# Patient Record
Sex: Male | Born: 1960 | Race: White | Hispanic: No | Marital: Married | Smoking: Former smoker
Health system: Southern US, Community
[De-identification: ages and names within clinical notes are randomized; demographics above are authoritative.]

## PROBLEM LIST (undated history)

## (undated) DIAGNOSIS — E249 Cushing's syndrome, unspecified: Secondary | ICD-10-CM

## (undated) HISTORY — PX: HERNIA REPAIR: SHX51

## (undated) HISTORY — PX: HEMORRHOID SURGERY: SHX153

## (undated) HISTORY — PX: OTHER SURGICAL HISTORY: SHX169

## (undated) HISTORY — PX: BACK SURGERY: SHX140

---

## 2000-09-25 ENCOUNTER — Other Ambulatory Visit: Admission: RE | Admit: 2000-09-25 | Discharge: 2000-09-25 | Payer: Self-pay | Admitting: Gastroenterology

## 2000-09-25 ENCOUNTER — Encounter (INDEPENDENT_AMBULATORY_CARE_PROVIDER_SITE_OTHER): Payer: Self-pay | Admitting: Specialist

## 2000-10-10 ENCOUNTER — Ambulatory Visit (HOSPITAL_COMMUNITY): Admission: RE | Admit: 2000-10-10 | Discharge: 2000-10-10 | Payer: Self-pay | Admitting: Gastroenterology

## 2000-10-10 ENCOUNTER — Encounter: Payer: Self-pay | Admitting: Gastroenterology

## 2005-04-03 ENCOUNTER — Ambulatory Visit: Payer: Self-pay | Admitting: Internal Medicine

## 2005-10-10 ENCOUNTER — Ambulatory Visit: Payer: Self-pay | Admitting: Internal Medicine

## 2007-07-27 ENCOUNTER — Emergency Department (HOSPITAL_COMMUNITY): Admission: EM | Admit: 2007-07-27 | Discharge: 2007-07-27 | Payer: Self-pay | Admitting: Emergency Medicine

## 2007-07-28 ENCOUNTER — Ambulatory Visit: Payer: Self-pay | Admitting: Internal Medicine

## 2007-07-31 ENCOUNTER — Ambulatory Visit: Payer: Self-pay | Admitting: Internal Medicine

## 2007-08-03 ENCOUNTER — Encounter: Admission: RE | Admit: 2007-08-03 | Discharge: 2007-08-03 | Payer: Self-pay | Admitting: Internal Medicine

## 2007-08-12 ENCOUNTER — Ambulatory Visit: Payer: Self-pay | Admitting: Internal Medicine

## 2007-08-19 ENCOUNTER — Encounter: Payer: Self-pay | Admitting: Internal Medicine

## 2007-08-20 ENCOUNTER — Ambulatory Visit (HOSPITAL_COMMUNITY): Admission: RE | Admit: 2007-08-20 | Discharge: 2007-08-21 | Payer: Self-pay | Admitting: Neurosurgery

## 2007-09-16 ENCOUNTER — Encounter: Payer: Self-pay | Admitting: Internal Medicine

## 2007-10-05 ENCOUNTER — Encounter: Payer: Self-pay | Admitting: Internal Medicine

## 2007-10-12 ENCOUNTER — Ambulatory Visit: Payer: Self-pay | Admitting: Internal Medicine

## 2007-10-12 DIAGNOSIS — E739 Lactose intolerance, unspecified: Secondary | ICD-10-CM

## 2007-10-12 DIAGNOSIS — F411 Generalized anxiety disorder: Secondary | ICD-10-CM | POA: Insufficient documentation

## 2007-10-12 DIAGNOSIS — Z8719 Personal history of other diseases of the digestive system: Secondary | ICD-10-CM

## 2007-10-12 DIAGNOSIS — I10 Essential (primary) hypertension: Secondary | ICD-10-CM | POA: Insufficient documentation

## 2007-10-12 DIAGNOSIS — Z860100 Personal history of colon polyps, unspecified: Secondary | ICD-10-CM | POA: Insufficient documentation

## 2007-10-12 DIAGNOSIS — K219 Gastro-esophageal reflux disease without esophagitis: Secondary | ICD-10-CM

## 2007-10-12 DIAGNOSIS — E785 Hyperlipidemia, unspecified: Secondary | ICD-10-CM | POA: Insufficient documentation

## 2007-10-12 DIAGNOSIS — R609 Edema, unspecified: Secondary | ICD-10-CM

## 2007-10-12 LAB — CONVERTED CEMR LAB
ALT: 49 units/L (ref 0–53)
Basophils Relative: 0.8 % (ref 0.0–1.0)
Bilirubin Urine: NEGATIVE
Bilirubin, Direct: 0.1 mg/dL (ref 0.0–0.3)
CO2: 30 meq/L (ref 19–32)
Cholesterol: 195 mg/dL (ref 0–200)
Eosinophils Absolute: 0.1 10*3/uL (ref 0.0–0.6)
Eosinophils Relative: 0.9 % (ref 0.0–5.0)
GFR calc Af Amer: 103 mL/min
GFR calc non Af Amer: 85 mL/min
Glucose, Bld: 99 mg/dL (ref 70–99)
Hemoglobin: 15 g/dL (ref 13.0–17.0)
Leukocytes, UA: NEGATIVE
Lymphocytes Relative: 19 % (ref 12.0–46.0)
MCV: 85.3 fL (ref 78.0–100.0)
Monocytes Absolute: 0.9 10*3/uL — ABNORMAL HIGH (ref 0.2–0.7)
Neutro Abs: 9.1 10*3/uL — ABNORMAL HIGH (ref 1.4–7.7)
Neutrophils Relative %: 72.5 % (ref 43.0–77.0)
Nitrite: NEGATIVE
PSA: 0.2 ng/mL (ref 0.10–4.00)
Potassium: 4.6 meq/L (ref 3.5–5.1)
Sodium: 140 meq/L (ref 135–145)
Specific Gravity, Urine: 1.015 (ref 1.000–1.03)
TSH: 0.89 microintl units/mL (ref 0.35–5.50)
Total CHOL/HDL Ratio: 3.9
Total Protein, Urine: NEGATIVE mg/dL
Total Protein: 7.3 g/dL (ref 6.0–8.3)
Triglycerides: 143 mg/dL (ref 0–149)
Urine Glucose: NEGATIVE mg/dL
WBC: 12.6 10*3/uL — ABNORMAL HIGH (ref 4.5–10.5)

## 2007-10-19 ENCOUNTER — Encounter: Admission: RE | Admit: 2007-10-19 | Discharge: 2007-10-19 | Payer: Self-pay | Admitting: Neurosurgery

## 2007-10-28 ENCOUNTER — Encounter: Payer: Self-pay | Admitting: Internal Medicine

## 2007-10-29 ENCOUNTER — Encounter: Payer: Self-pay | Admitting: Internal Medicine

## 2007-11-03 ENCOUNTER — Ambulatory Visit: Payer: Self-pay | Admitting: Unknown Physician Specialty

## 2007-11-24 ENCOUNTER — Ambulatory Visit: Payer: Self-pay | Admitting: Internal Medicine

## 2007-11-24 DIAGNOSIS — R1012 Left upper quadrant pain: Secondary | ICD-10-CM

## 2007-11-24 LAB — CONVERTED CEMR LAB
Albumin: 3.9 g/dL (ref 3.5–5.2)
Creatinine, Ser: 1.1 mg/dL (ref 0.4–1.5)
GFR calc non Af Amer: 76 mL/min
Phosphorus: 4.1 mg/dL (ref 2.3–4.6)
Potassium: 5 meq/L (ref 3.5–5.1)
Sodium: 141 meq/L (ref 135–145)

## 2007-11-27 ENCOUNTER — Telehealth (INDEPENDENT_AMBULATORY_CARE_PROVIDER_SITE_OTHER): Payer: Self-pay | Admitting: *Deleted

## 2007-12-03 ENCOUNTER — Encounter: Payer: Self-pay | Admitting: Internal Medicine

## 2008-01-05 ENCOUNTER — Encounter: Payer: Self-pay | Admitting: Internal Medicine

## 2008-01-22 ENCOUNTER — Ambulatory Visit: Payer: Self-pay | Admitting: Endocrinology

## 2008-01-22 ENCOUNTER — Encounter: Payer: Self-pay | Admitting: Internal Medicine

## 2008-01-27 LAB — CONVERTED CEMR LAB
CO2: 30 meq/L (ref 19–32)
Creatinine, Ser: 1.1 mg/dL (ref 0.4–1.5)
GFR calc non Af Amer: 76 mL/min
Glucose, Bld: 109 mg/dL — ABNORMAL HIGH (ref 70–99)

## 2008-02-08 ENCOUNTER — Ambulatory Visit: Payer: Self-pay | Admitting: Internal Medicine

## 2008-02-08 DIAGNOSIS — M79609 Pain in unspecified limb: Secondary | ICD-10-CM

## 2008-02-08 DIAGNOSIS — F528 Other sexual dysfunction not due to a substance or known physiological condition: Secondary | ICD-10-CM

## 2008-02-12 ENCOUNTER — Ambulatory Visit: Payer: Self-pay

## 2008-02-12 ENCOUNTER — Encounter: Payer: Self-pay | Admitting: Internal Medicine

## 2008-02-18 ENCOUNTER — Encounter: Payer: Self-pay | Admitting: Internal Medicine

## 2008-02-23 ENCOUNTER — Encounter: Payer: Self-pay | Admitting: Internal Medicine

## 2008-02-24 ENCOUNTER — Telehealth (INDEPENDENT_AMBULATORY_CARE_PROVIDER_SITE_OTHER): Payer: Self-pay | Admitting: *Deleted

## 2008-02-29 ENCOUNTER — Telehealth: Payer: Self-pay | Admitting: Endocrinology

## 2008-03-01 ENCOUNTER — Ambulatory Visit: Payer: Self-pay | Admitting: Internal Medicine

## 2008-03-01 DIAGNOSIS — R209 Unspecified disturbances of skin sensation: Secondary | ICD-10-CM | POA: Insufficient documentation

## 2008-03-24 ENCOUNTER — Telehealth: Payer: Self-pay | Admitting: Internal Medicine

## 2008-04-04 ENCOUNTER — Ambulatory Visit: Payer: Self-pay | Admitting: Internal Medicine

## 2008-04-05 ENCOUNTER — Telehealth (INDEPENDENT_AMBULATORY_CARE_PROVIDER_SITE_OTHER): Payer: Self-pay | Admitting: *Deleted

## 2008-04-05 DIAGNOSIS — IMO0002 Reserved for concepts with insufficient information to code with codable children: Secondary | ICD-10-CM | POA: Insufficient documentation

## 2008-05-02 ENCOUNTER — Telehealth (INDEPENDENT_AMBULATORY_CARE_PROVIDER_SITE_OTHER): Payer: Self-pay | Admitting: *Deleted

## 2008-05-03 ENCOUNTER — Encounter: Payer: Self-pay | Admitting: Internal Medicine

## 2008-05-04 ENCOUNTER — Encounter: Payer: Self-pay | Admitting: Internal Medicine

## 2008-05-05 ENCOUNTER — Telehealth: Payer: Self-pay | Admitting: Internal Medicine

## 2008-05-06 ENCOUNTER — Encounter: Payer: Self-pay | Admitting: Internal Medicine

## 2008-05-11 ENCOUNTER — Telehealth: Payer: Self-pay | Admitting: Internal Medicine

## 2008-05-27 ENCOUNTER — Encounter: Payer: Self-pay | Admitting: Internal Medicine

## 2008-07-07 ENCOUNTER — Encounter: Payer: Self-pay | Admitting: Internal Medicine

## 2009-02-28 ENCOUNTER — Encounter (INDEPENDENT_AMBULATORY_CARE_PROVIDER_SITE_OTHER): Payer: Self-pay | Admitting: *Deleted

## 2009-12-07 ENCOUNTER — Telehealth: Payer: Self-pay | Admitting: Gastroenterology

## 2010-12-27 NOTE — Progress Notes (Signed)
Summary: Schedule Colonosopy   Phone Note Outgoing Call Call back at [facility] Phone [phone]   Call placed by: Harlow Mares CMA Duncan Dull),  December 07, 2009 2:37 PM Call placed to: Patient Summary of Call: Left message on patients machine to call back. patient needs to schedule a direct colonoscopy Initial call taken by: Harlow Mares CMA Duncan Dull),  December 07, 2009 2:37 PM  Follow-up for Phone Call        No Answer just rang Follow-up by: Harlow Mares CMA Duncan Dull),  December 15, 2009 10:15 AM  Additional Follow-up for Phone Call Additional follow up Details #1::        number busy Additional Follow-up by: Harlow Mares CMA ([facility]AMA),  December 21, 2009 10:26 AM

## 2011-04-09 NOTE — Op Note (Signed)
NAMEELAD, MACPHAIL NO.:  [id number]   MEDICAL RECORD NO.:  [id number]          PATIENT TYPE:  OIB   LOCATION:  3020                         FACILITY:  [facility]   PHYSICIAN:  Hewitt Shorts, M.D.DATE OF BIRTH:  March 16, 1961   DATE OF PROCEDURE:  DATE OF DISCHARGE:                               OPERATIVE REPORT   PREOPERATIVE DIAGNOSES:  1. 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  2. 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  3. Lumbar spondylosis.  4. Lumbar radiculopathy.   POSTOPERATIVE DIAGNOSES:  1. 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  2. 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  3. Lumbar spondylosis.  4. Lumbar radiculopathy.   PROCEDURES:  1. Left L3 hemilaminectomy.  2. Resection of L2-3 synovial cyst and decompression of the left L3      nerve root with microdissection.  3. Left L4 foraminotomy and decompression of the left L4 nerve root      with microdissection.   SURGEON:  Hewitt Shorts, M.D.   ASSISTANT:  Jerald Kief, NP and Stefani Dama, M.D.   ANESTHESIA:  General endotracheal.   INDICATIONS FOR PROCEDURE:  Patient is a 50 year old man who presented  with left lumbar radiculopathy with weakness of the left iliopsoas.  He  was found to have a synovial cyst of the left side at L2-3 compressing  the exiting left L3 nerve root and he was found to have significant left  L3-4 lateral recess stenosis.  The decision was made to proceed with  decompression of the exiting L3 and L4 nerve roots via resection of the  synovial cyst and decompression of the lateral recess stenosis.   DESCRIPTION OF PROCEDURE:  Patient was brought to the operating room and  placed under general endotracheal anesthesia.  Patient was returned to a  prone position.  Lumbar region was prepped with Betadine and saline  solution and draped in a sterile fashion.  The midline was infiltrated  with local anesthetic with epinephrine.  An x-ray was taken and the L2-3  and L3-4 level was identified.  A midline  incision was made from the L2  to the L4 level and dissection was carried down to the subcutaneous  tissue.  Bipolar cautery and electrocautery was used to maintain  hemostasis.  Dissection was carried down to the lumbar fascia which was  incised to the left side of the midline in the paraspinal muscles.  We  dissected the spinous processes and lamina in a subperiosteal fashion.  A self-retaining retractor was placed.  We identified the L2, L3 and L4  lamina and the L2-3 and L3-4 interlaminar spaces.  An x-ray was taken to  confirm the localization.  Then the microscope was draped and brought  into the field to provide additional magnification, illumination, and  visualization and a decompression was performed using microdissection  and microsurgical technique.   We first performed a left L3 hemilaminectomy using the XMax drill and  Kerrison punches.  Edges of the bone were waxed as needed to maintain  hemostasis and we identified the ligament of flavum at the L3-4 level.  The laminectomy was  extended rostrally until we reached the ligamentum  flavum at the L2-3 level.  A complete hemilaminectomy was performed.  We  removed the ligamentum flavum to further decompress the spinal canal and  thecal sac.   We then proceeded with resection of the synovial cyst.  It was located  laterally to the left compressing the exiting the left L3 nerve root as  it exited into the L3-4 neural foramen.  We were able to mobilize it in  a piecemeal fashion and it was removed in its entirety with good  decompression of the thecal sac and exiting left L3 nerve root.   We then turned our attention to decompress the left L4 nerve root.  There was significant ligamentum flavum thickening and bony overgrowth  at the L3-4 level.  We were able to decompress the lateral recess  stenosis and extend the decompression into the L4 nerve root foramen and  decompress the left L4 nerve root.  The wound was then irrigated  with  bacitracin; incision checked for hemostasis, which was established with  the use of Gelfoam soaked in thrombin.  Once hemostasis was established,  we removed the Gelfoam.  Hemostasis was again confirmed and then we  instilled 2 mL of Fentanyl, 80 mg of Depo-Medrol into the epidural space  and proceeded with closure.  The deep fascia was closed interrupted  undyed 1 Vicryl sutures.  The Scarpa's fascia was closed with  interrupted undyed 1 Vicryl suture.  The subcutaneous and subcuticular  layer were closed with interrupted inverted 2-0 undyed Vicryl sutures  and the skin was reapproximated with Dermabond.  Procedure was tolerated  well.  The estimated blood loss was 50 ml.  Sponge and needle count were  correct.  Following surgery, the patient was turned back to the supine  position to be reversed from the anesthetic, extubated, and transferred  to the recovery room for further care.      Hewitt Shorts, M.D.  Electronically Signed     RWN/MEDQ  D:  08/20/2007  T:  08/20/2007  Job:  [id number]   cc:   Hewitt Shorts, M.D.

## 2011-09-05 LAB — BASIC METABOLIC PANEL
BUN: 12
CO2: 28
Calcium: 9.5
Chloride: 104
Creatinine, Ser: 0.98
GFR calc Af Amer: >60
GFR calc non Af Amer: >60
Glucose, Bld: 95
Potassium: 4.5
Sodium: 140

## 2011-09-05 LAB — CBC
HCT: 42.9
Hemoglobin: 14.2
MCHC: 33
MCV: 82.4
Platelets: 236
RBC: 5.21
RDW: 15.3 — ABNORMAL HIGH
WBC: 11 — ABNORMAL HIGH

## 2012-06-18 ENCOUNTER — Encounter: Payer: Self-pay | Admitting: Gastroenterology

## 2013-08-26 ENCOUNTER — Encounter (HOSPITAL_COMMUNITY): Payer: Self-pay

## 2013-08-26 ENCOUNTER — Emergency Department (HOSPITAL_COMMUNITY)
Admission: EM | Admit: 2013-08-26 | Discharge: 2013-08-27 | Disposition: A | Discharge status: Home / Self Care | Payer: Medicare Other | Attending: Emergency Medicine | Admitting: Emergency Medicine

## 2013-08-26 DIAGNOSIS — Z87891 Personal history of nicotine dependence: Secondary | ICD-10-CM | POA: Insufficient documentation

## 2013-08-26 DIAGNOSIS — Z880 Allergy status to penicillin: Secondary | ICD-10-CM | POA: Insufficient documentation

## 2013-08-26 DIAGNOSIS — R1013 Epigastric pain: Secondary | ICD-10-CM | POA: Insufficient documentation

## 2013-08-26 DIAGNOSIS — IMO0002 Reserved for concepts with insufficient information to code with codable children: Secondary | ICD-10-CM | POA: Insufficient documentation

## 2013-08-26 DIAGNOSIS — R110 Nausea: Secondary | ICD-10-CM | POA: Insufficient documentation

## 2013-08-26 DIAGNOSIS — E249 Cushing's syndrome, unspecified: Secondary | ICD-10-CM | POA: Insufficient documentation

## 2013-08-26 HISTORY — DX: Cushing's syndrome, unspecified: E24.9

## 2013-08-26 NOTE — ED Notes (Signed)
Pt arrives via EMS from home with abdominal pain that started around 2100.  Pt starts around the umbilicus and radiates to the back on both sides with nausea

## 2013-08-27 ENCOUNTER — Emergency Department (HOSPITAL_COMMUNITY)
Admission: EM | Admit: 2013-08-27 | Discharge: 2013-08-27 | Disposition: A | Discharge status: Home / Self Care | Payer: Medicare Other | Source: Home / Self Care | Attending: Emergency Medicine | Admitting: Emergency Medicine

## 2013-08-27 ENCOUNTER — Emergency Department (HOSPITAL_COMMUNITY): Payer: Medicare Other

## 2013-08-27 ENCOUNTER — Encounter (HOSPITAL_COMMUNITY): Payer: Self-pay | Admitting: Radiology

## 2013-08-27 ENCOUNTER — Encounter (HOSPITAL_COMMUNITY): Payer: Self-pay | Admitting: Physical Medicine and Rehabilitation

## 2013-08-27 DIAGNOSIS — Z87891 Personal history of nicotine dependence: Secondary | ICD-10-CM | POA: Insufficient documentation

## 2013-08-27 DIAGNOSIS — K3189 Other diseases of stomach and duodenum: Secondary | ICD-10-CM | POA: Insufficient documentation

## 2013-08-27 DIAGNOSIS — Z79899 Other long term (current) drug therapy: Secondary | ICD-10-CM | POA: Insufficient documentation

## 2013-08-27 DIAGNOSIS — Z862 Personal history of diseases of the blood and blood-forming organs and certain disorders involving the immune mechanism: Secondary | ICD-10-CM | POA: Insufficient documentation

## 2013-08-27 DIAGNOSIS — Z8639 Personal history of other endocrine, nutritional and metabolic disease: Secondary | ICD-10-CM | POA: Insufficient documentation

## 2013-08-27 DIAGNOSIS — IMO0002 Reserved for concepts with insufficient information to code with codable children: Secondary | ICD-10-CM | POA: Insufficient documentation

## 2013-08-27 DIAGNOSIS — R070 Pain in throat: Secondary | ICD-10-CM | POA: Insufficient documentation

## 2013-08-27 DIAGNOSIS — Z880 Allergy status to penicillin: Secondary | ICD-10-CM | POA: Insufficient documentation

## 2013-08-27 DIAGNOSIS — K30 Functional dyspepsia: Secondary | ICD-10-CM

## 2013-08-27 LAB — CBC
HCT: 40.5 % (ref 39.0–52.0)
Hemoglobin: 14.1 g/dL (ref 13.0–17.0)
MCH: 27 pg (ref 26.0–34.0)
MCHC: 34.8 g/dL (ref 30.0–36.0)
MCV: 77.4 fL — ABNORMAL LOW (ref 78.0–100.0)
RBC: 5.23 MIL/uL (ref 4.22–5.81)

## 2013-08-27 LAB — POCT I-STAT TROPONIN I: Troponin i, poc: 0 ng/mL (ref 0.00–0.08)

## 2013-08-27 LAB — TROPONIN I: Troponin I: <0.3 ng/mL (ref <0.30)

## 2013-08-27 LAB — POCT I-STAT, CHEM 8
BUN: 9 mg/dL (ref 6–23)
Calcium, Ion: 1.18 mmol/L (ref 1.12–1.23)
Creatinine, Ser: 1 mg/dL (ref 0.50–1.35)
Hemoglobin: 14.6 g/dL (ref 13.0–17.0)
Sodium: 141 mEq/L (ref 135–145)
TCO2: 22 mmol/L (ref 0–100)

## 2013-08-27 LAB — URINALYSIS, ROUTINE W REFLEX MICROSCOPIC
Hgb urine dipstick: NEGATIVE
Nitrite: NEGATIVE
Protein, ur: NEGATIVE mg/dL
Specific Gravity, Urine: 1.033 — ABNORMAL HIGH (ref 1.005–1.030)
Urobilinogen, UA: 0.2 mg/dL (ref 0.0–1.0)

## 2013-08-27 LAB — COMPREHENSIVE METABOLIC PANEL
ALT: 15 U/L (ref 0–53)
BUN: 10 mg/dL (ref 6–23)
CO2: 22 mEq/L (ref 19–32)
Calcium: 9.6 mg/dL (ref 8.4–10.5)
Creatinine, Ser: 1.01 mg/dL (ref 0.50–1.35)
GFR calc Af Amer: >90 mL/min (ref >90)
GFR calc non Af Amer: 82 mL/min — ABNORMAL LOW (ref >90)
Glucose, Bld: 129 mg/dL — ABNORMAL HIGH (ref 70–99)
Sodium: 139 mEq/L (ref 135–145)
Total Protein: 7.1 g/dL (ref 6.0–8.3)

## 2013-08-27 LAB — LIPASE, BLOOD: Lipase: 45 U/L (ref 11–59)

## 2013-08-27 MED ORDER — FAMOTIDINE 20 MG PO TABS
20.0000 mg | ORAL_TABLET | Freq: Once | ORAL | Status: AC
  Administered 2013-08-27: 20 mg via ORAL
  Filled 2013-08-27: qty 1

## 2013-08-27 MED ORDER — ONDANSETRON HCL 4 MG/2ML IJ SOLN
4.0000 mg | Freq: Once | INTRAMUSCULAR | Status: AC
  Administered 2013-08-27: 4 mg via INTRAVENOUS
  Filled 2013-08-27: qty 2

## 2013-08-27 MED ORDER — GI COCKTAIL ~~LOC~~
30.0000 mL | Freq: Once | ORAL | Status: AC
  Administered 2013-08-27: 30 mL via ORAL
  Filled 2013-08-27: qty 30

## 2013-08-27 MED ORDER — PANTOPRAZOLE SODIUM 40 MG IV SOLR
40.0000 mg | Freq: Once | INTRAVENOUS | Status: AC
  Administered 2013-08-27: 40 mg via INTRAVENOUS
  Filled 2013-08-27: qty 40

## 2013-08-27 MED ORDER — IOHEXOL 300 MG/ML  SOLN
25.0000 mL | INTRAMUSCULAR | Status: AC
  Administered 2013-08-27: 25 mL via ORAL

## 2013-08-27 MED ORDER — HYDROMORPHONE HCL PF 1 MG/ML IJ SOLN
1.0000 mg | Freq: Once | INTRAMUSCULAR | Status: AC
  Administered 2013-08-27: 1 mg via INTRAVENOUS
  Filled 2013-08-27: qty 1

## 2013-08-27 MED ORDER — FAMOTIDINE 20 MG PO TABS: 20.0000 mg | ORAL_TABLET | Freq: Two times a day (BID) | ORAL | Status: DC | PRN

## 2013-08-27 MED ORDER — FENTANYL CITRATE 0.05 MG/ML IJ SOLN
50.0000 ug | INTRAMUSCULAR | Status: DC | PRN
  Administered 2013-08-27: 50 ug via INTRAVENOUS
  Filled 2013-08-27: qty 2

## 2013-08-27 MED ORDER — ESOMEPRAZOLE MAGNESIUM 40 MG PO CPDR: 40.0000 mg | DELAYED_RELEASE_CAPSULE | Freq: Every day | ORAL | Status: DC

## 2013-08-27 MED ORDER — SODIUM CHLORIDE 0.9 % IV SOLN
INTRAVENOUS | Status: DC
  Administered 2013-08-27 (×2): via INTRAVENOUS

## 2013-08-27 MED ORDER — SUCRALFATE 1 G PO TABS: 1.0000 g | ORAL_TABLET | Freq: Four times a day (QID) | ORAL | Status: DC

## 2013-08-27 MED ORDER — IOHEXOL 300 MG/ML  SOLN
100.0000 mL | Freq: Once | INTRAMUSCULAR | Status: AC | PRN
  Administered 2013-08-27: 100 mL via INTRAVENOUS

## 2013-08-27 NOTE — ED Notes (Signed)
Pt presents to department for evaluation of sore throat. Was evaluated last night for chest pain, was given GI cocktail. Now states hoarse voice and blisters in throat. 8/10 pain at the time. Respirations unlabored. Pt is alert and oriented x4.

## 2013-08-27 NOTE — ED Provider Notes (Signed)
CSN: [id number]     Arrival date & time 08/27/13  1031 History   First MD Initiated Contact with Patient 08/27/13 1053     Chief Complaint  Patient presents with  . Sore Throat   (Consider location/radiation/quality/duration/timing/severity/associated sxs/prior Treatment) Patient is a 52 y.o. male presenting with pharyngitis. The history is provided by the patient and medical records.  Sore Throat Associated symptoms include a sore throat.   Pt presents to the ED for sore throat and hoarseness.  Pt  was seen and evaluated in the ED yesterday for chest pain/epigastric pain.  States he was given a GI cocktail and felt a burning sensation afterwards but was told it was normal.  Pt states he continued having throat pain but went to bed.  Upon waking this morning, his voice was raspy and he felt as though his throat is "raw" when swallowing.  No difficulty swallowing.  States he continues to have indigestion sx but has not taken any other medications.  No known allergies to components of GI cocktail.  No throat sx prior to GI cocktail.  Denies any sensation of SOB or that his throat is closing.  Pt does have a hx of reflux, used to take nexium, no problems in a while.  Past Medical History  Diagnosis Date  . Cushing syndrome    Past Surgical History  Procedure Laterality Date  . Putitary tumor removed    . Back surgery    . Hernia repair    . Hemorrhoid surgery     History reviewed. No pertinent family history. History  Substance Use Topics  . Smoking status: Former Smoker    Types: Cigars  . Smokeless tobacco: Not on file  . Alcohol Use: No    Review of Systems  HENT: Positive for sore throat.   All other systems reviewed and are negative.    Allergies  Codeine; Gabapentin; Hydrocodone-acetaminophen; and Penicillins  Home Medications   Current Outpatient Rx  Name  Route  Sig  Dispense  Refill  . esomeprazole (NEXIUM) 40 MG capsule   Oral   Take 1 capsule (40 mg total) by  mouth daily.   30 capsule   0   . hydrocortisone (CORTEF) 10 MG tablet   Oral   Take 20 mg by mouth daily.          BP 106/65  Pulse 49  Temp(Src) 98.2 F (36.8 C) (Oral)  Resp 18  Ht 6\' 2"  (1.88 m)  Wt 230 lb (104.327 kg)  BMI 29.52 kg/m2  SpO2 97%  Physical Exam  Nursing note and vitals reviewed. Constitutional: He is oriented to person, place, and time. He appears well-developed and well-nourished. No distress.  HENT:  Head: Normocephalic and atraumatic.  Mouth/Throat: Uvula is midline, oropharynx is clear and moist and mucous membranes are normal. No oral lesions. No trismus in the jaw. No oropharyngeal exudate, posterior oropharyngeal edema, posterior oropharyngeal erythema or tonsillar abscesses.  Oropharynx erythematous without visible blisters; tonsils appear mildly swollen without exudate or blisters; airway patent, pt speaking in full complete sentences; voice is raspy  Eyes: Conjunctivae and EOM are normal. Pupils are equal, round, and reactive to light.  Neck: Normal range of motion. Neck supple.  Cardiovascular: Normal rate, regular rhythm and normal heart sounds.   Pulmonary/Chest: Effort normal and breath sounds normal. No respiratory distress. He has no wheezes.  Abdominal: Soft. Bowel sounds are normal. There is no tenderness. There is no guarding.  Musculoskeletal: Normal range of motion.  Neurological: He is alert and oriented to person, place, and time.  Skin: Skin is warm and dry. He is not diaphoretic.  Psychiatric: He has a normal mood and affect.    ED Course  Procedures (including critical care time) Labs Review Labs Reviewed - No data to display Imaging Review   MDM   1. Throat pain   2. Acid indigestion     I have reviewed labs and imaging studies last night-- labs notable for mild hypokalemia at 3.3, CXR and CT negative for acute processes.  Initial and delta trop negative.  Pt was re-started on his nexium, given GI FU.  PE is largely  unremarkable, oropharynx appears slightly erythematous without noted exudate or blisters. Dr. Hyacinth Meeker has also evaluated the patient and agrees with assessment. Advised to start patient on Pepcid, Carafate, and use Chloraseptic Spray for acute relief of pain. Patient will followup with GI as previously discussed. Discussed plan with patient and family, they agreed. Return precautions advised.  Garlon Hatchet, PA-C 08/27/13 1239

## 2013-08-27 NOTE — ED Provider Notes (Signed)
CSN: [id number]     Arrival date & time 08/26/13  2349 History   First MD Initiated Contact with Patient 08/27/13 0017     Chief Complaint  Patient presents with  . Abdominal Pain   (Consider location/radiation/quality/duration/timing/severity/associated sxs/prior Treatment) HPI History provided by patient. At home tonight developed sharp epigastric pain radiating to his back. Associated nausea but no vomiting or diarrhea. No chest pain or shortness of breath. No known aggravating or alleviating factors. No history of same. Patient takes Cortef daily for history of Cushing syndrome, followed by primary care at [facility]. No history of similar symptoms. No black or tarry stools. No blood in stools. No heartburn or reflux. Symptoms moderate to severe. Past Medical History  Diagnosis Date  . Cushing syndrome    Past Surgical History  Procedure Laterality Date  . Putitary tumor removed    . Back surgery    . Hernia repair    . Hemorrhoid surgery     No family history on file. History  Substance Use Topics  . Smoking status: Former Smoker    Types: Cigars  . Smokeless tobacco: Not on file  . Alcohol Use: No    Review of Systems  Constitutional: Negative for fever and chills.  HENT: Negative for neck pain and neck stiffness.   Respiratory: Negative for shortness of breath.   Cardiovascular: Negative for chest pain.  Gastrointestinal: Positive for abdominal pain. Negative for blood in stool.  Genitourinary: Negative for dysuria and hematuria.  Musculoskeletal: Negative for myalgias.  Skin: Negative for rash.  Neurological: Negative for headaches.  All other systems reviewed and are negative.    Allergies  Codeine; Gabapentin; Hydrocodone-acetaminophen; and Penicillins  Home Medications   Current Outpatient Rx  Name  Route  Sig  Dispense  Refill  . hydrocortisone (CORTEF) 10 MG tablet   Oral   Take 20 mg by mouth daily.          BP 126/84  Pulse 52  Temp(Src) 98 F (36.7  C) (Oral)  Resp 12  SpO2 99% Physical Exam  Constitutional: He is oriented to person, place, and time. He appears well-developed and well-nourished.  HENT:  Head: Normocephalic and atraumatic.  Eyes: EOM are normal. Pupils are equal, round, and reactive to light. No scleral icterus.  Neck: Neck supple.  Cardiovascular: Normal rate, regular rhythm and intact distal pulses.   Pulmonary/Chest: Effort normal and breath sounds normal. No respiratory distress. He exhibits no tenderness.  Abdominal: Soft. Bowel sounds are normal.  Epigastric tenderness. No guarding or rebound. No abdominal tenderness otherwise. Negative Murphy sign. No tenderness over McBurney's point  Musculoskeletal: Normal range of motion. He exhibits no edema.  Neurological: He is alert and oriented to person, place, and time.  Skin: Skin is warm and dry.    ED Course  Procedures (including critical care time) Labs Review Labs Reviewed  CBC - Abnormal; Notable for the following:    MCV 77.4 (*)    All other components within normal limits  COMPREHENSIVE METABOLIC PANEL - Abnormal; Notable for the following:    Potassium 3.3 (*)    Glucose, Bld 129 (*)    GFR calc non Af Amer 82 (*)    All other components within normal limits  URINALYSIS, ROUTINE W REFLEX MICROSCOPIC - Abnormal; Notable for the following:    Specific Gravity, Urine 1.033 (*)    Ketones, ur 15 (*)    All other components within normal limits  POCT I-STAT, CHEM 8 -  Abnormal; Notable for the following:    Potassium 3.3 (*)    Glucose, Bld 128 (*)    All other components within normal limits  LIPASE, BLOOD  TROPONIN I   Imaging Review Ct Abdomen Pelvis W Contrast  08/27/2013   CLINICAL DATA:  Mid abdominal pain.  EXAM: CT ABDOMEN AND PELVIS WITH CONTRAST  TECHNIQUE: Multidetector CT imaging of the abdomen and pelvis was performed using the standard protocol following bolus administration of intravenous contrast.  CONTRAST:  OMNIPAQUE  IOHEXOL 300 MG/ML  SOLN  COMPARISON:  11/24/2007  FINDINGS: Lung bases are clear. No effusions. Heart is normal size.  Liver, gallbladder, spleen, pancreas, adrenals and kidneys are normal. Appendix is visualized and is normal.  Stomach, large and small bowel are unremarkable. Aorta is normal caliber. Urinary bladder is decompressed.  No acute bony abnormality. Degenerative changes in the lumbar spine.  IMPRESSION: No acute findings in the abdomen or pelvis.   Electronically Signed   By: Charlett Nose M.D.   On: 08/27/2013 02:14   Dg Chest Portable 1 View  08/27/2013   CLINICAL DATA:  Chest pain and shortness of Breath.  EXAM: PORTABLE CHEST - 1 VIEW  COMPARISON:  08/20/2007.  FINDINGS: The cardiac silhouette, mediastinal and hilar contours are within normal limits and stable. Bronchitic type lung changes are noted an most likely related to smoking. No infiltrates, effusions or edema. The bony thorax is intact. Remote healed fractures are noted.  IMPRESSION: No acute cardiopulmonary findings.   Electronically Signed   By: Loralie Champagne M.D.   On: 08/27/2013 00:37    Date: 08/27/2013  Rate: 52  Rhythm: sinus bradycardia  QRS Axis: normal  Intervals: normal  ST/T Wave abnormalities: nonspecific ST changes  Conduction Disutrbances:none  Narrative Interpretation:   Old EKG Reviewed: No significant changes  IV fentanyl. IV Zofran. IV Dilaudid. IV Protonix  3:37 AM on recheck pain improved. Has some mild epigastric tenderness - no acute abdomen. CT scan reviewed as above with patient. On further history he was previously taking Nexium and followed by [facility], has not had any problems with reflux or gastritis in some time.  Tolerates by mouth fluids, repeat troponin remains negative, symptomatically improved. Stable for discharge home. Patient requesting GI referral. He agrees to close primary care followup. He agrees to strict return precautions.  MDM  Dx: Epigastric pain  Evaluated with EKG,  labs, imaging or reviewed as above. Symptomatically improved with IV narcotics and medications. Vital signs and nursing notes reviewed and considered  Sunnie Nielsen, MD 08/27/13 [phone]

## 2013-08-27 NOTE — ED Provider Notes (Signed)
52 year old male, no cardiac risk factors who presents with a complaint of epigastric pain and a sore throat. On exam he has a erythematous throat but no signs of ulcerations, exudate, asymmetry or hypertrophy. His epigastrium is mildly tender, there is no guarding, no peritoneal signs, no distention. His heart and lung sounds are normal. According to the lab work and imaging that he had yesterday, there was no significant abnormalities found, he did have a normal EKG, normal CT scan and a normal chest x-ray. The patient is well-appearing, do not think this is cardiac disease, this is more gastrointestinal. He will be asked to use Chloraseptic spray in addition to Pepcid, proton pump inhibitor and Carafate in the short-term with gastroenterology followup   Medical screening examination/treatment/procedure(s) were conducted as a shared visit with non-physician practitioner(s) and myself.  I personally evaluated the patient during the encounter.   Vida Roller, MD 08/27/13 1125

## 2013-08-28 ENCOUNTER — Encounter (HOSPITAL_COMMUNITY): Payer: Self-pay | Admitting: *Deleted

## 2013-08-28 ENCOUNTER — Emergency Department (HOSPITAL_COMMUNITY)
Admission: EM | Admit: 2013-08-28 | Discharge: 2013-08-28 | Disposition: A | Discharge status: Home / Self Care | Payer: Medicare Other | Attending: Emergency Medicine | Admitting: Emergency Medicine

## 2013-08-28 DIAGNOSIS — IMO0002 Reserved for concepts with insufficient information to code with codable children: Secondary | ICD-10-CM | POA: Insufficient documentation

## 2013-08-28 DIAGNOSIS — Z862 Personal history of diseases of the blood and blood-forming organs and certain disorders involving the immune mechanism: Secondary | ICD-10-CM | POA: Insufficient documentation

## 2013-08-28 DIAGNOSIS — Z880 Allergy status to penicillin: Secondary | ICD-10-CM | POA: Insufficient documentation

## 2013-08-28 DIAGNOSIS — Z87891 Personal history of nicotine dependence: Secondary | ICD-10-CM | POA: Insufficient documentation

## 2013-08-28 DIAGNOSIS — Z79899 Other long term (current) drug therapy: Secondary | ICD-10-CM | POA: Insufficient documentation

## 2013-08-28 DIAGNOSIS — R1011 Right upper quadrant pain: Secondary | ICD-10-CM | POA: Insufficient documentation

## 2013-08-28 DIAGNOSIS — R112 Nausea with vomiting, unspecified: Secondary | ICD-10-CM | POA: Insufficient documentation

## 2013-08-28 DIAGNOSIS — Z8639 Personal history of other endocrine, nutritional and metabolic disease: Secondary | ICD-10-CM | POA: Insufficient documentation

## 2013-08-28 DIAGNOSIS — R1012 Left upper quadrant pain: Secondary | ICD-10-CM | POA: Insufficient documentation

## 2013-08-28 DIAGNOSIS — R1013 Epigastric pain: Secondary | ICD-10-CM

## 2013-08-28 LAB — CBC WITH DIFFERENTIAL/PLATELET
Basophils Absolute: 0 10*3/uL (ref 0.0–0.1)
Basophils Relative: 0 % (ref 0–1)
Eosinophils Absolute: 0.1 10*3/uL (ref 0.0–0.7)
Eosinophils Absolute: 0.1 10*3/uL (ref 0.0–0.7)
Eosinophils Relative: 1 % (ref 0–5)
Eosinophils Relative: 1 % (ref 0–5)
HCT: 41.5 % (ref 39.0–52.0)
Hemoglobin: 14.6 g/dL (ref 13.0–17.0)
Lymphocytes Relative: 20 % (ref 12–46)
Lymphocytes Relative: 18 % (ref 12–46)
Lymphs Abs: 2.9 10*3/uL (ref 0.7–4.0)
Lymphs Abs: 2.6 10*3/uL (ref 0.7–4.0)
MCH: 27.4 pg (ref 26.0–34.0)
MCH: 27.5 pg (ref 26.0–34.0)
MCHC: 35.2 g/dL (ref 30.0–36.0)
MCV: 78.3 fL (ref 78.0–100.0)
Monocytes Absolute: 1.2 10*3/uL — ABNORMAL HIGH (ref 0.1–1.0)
Monocytes Relative: 8 % (ref 3–12)
Neutrophils Relative %: 70 % (ref 43–77)
Neutrophils Relative %: 74 % (ref 43–77)
Platelets: 190 10*3/uL (ref 150–400)
Platelets: 204 10*3/uL (ref 150–400)
RBC: 5.29 MIL/uL (ref 4.22–5.81)
RBC: 5.3 MIL/uL (ref 4.22–5.81)
RDW: 13.5 % (ref 11.5–15.5)
WBC: 14 10*3/uL — ABNORMAL HIGH (ref 4.0–10.5)
WBC: 14.6 10*3/uL — ABNORMAL HIGH (ref 4.0–10.5)

## 2013-08-28 LAB — URINALYSIS, ROUTINE W REFLEX MICROSCOPIC
Bilirubin Urine: NEGATIVE
Bilirubin Urine: NEGATIVE
Glucose, UA: NEGATIVE mg/dL
Hgb urine dipstick: NEGATIVE
Ketones, ur: NEGATIVE mg/dL
Ketones, ur: NEGATIVE mg/dL
Leukocytes, UA: NEGATIVE
Nitrite: NEGATIVE
Nitrite: NEGATIVE
Protein, ur: NEGATIVE mg/dL
Specific Gravity, Urine: 1.013 (ref 1.005–1.030)
Specific Gravity, Urine: 1.011 (ref 1.005–1.030)
Urobilinogen, UA: 0.2 mg/dL (ref 0.0–1.0)
pH: 7.5 (ref 5.0–8.0)

## 2013-08-28 LAB — COMPREHENSIVE METABOLIC PANEL
ALT: 16 U/L (ref 0–53)
ALT: 15 U/L (ref 0–53)
AST: 17 U/L (ref 0–37)
Alkaline Phosphatase: 53 U/L (ref 39–117)
Alkaline Phosphatase: 55 U/L (ref 39–117)
BUN: 6 mg/dL (ref 6–23)
CO2: 27 mEq/L (ref 19–32)
CO2: 26 mEq/L (ref 19–32)
Calcium: 9.1 mg/dL (ref 8.4–10.5)
Calcium: 9.3 mg/dL (ref 8.4–10.5)
Chloride: 100 mEq/L (ref 96–112)
GFR calc Af Amer: >90 mL/min (ref >90)
GFR calc Af Amer: >90 mL/min (ref >90)
GFR calc non Af Amer: 80 mL/min — ABNORMAL LOW (ref >90)
Glucose, Bld: 103 mg/dL — ABNORMAL HIGH (ref 70–99)
Glucose, Bld: 103 mg/dL — ABNORMAL HIGH (ref 70–99)
Potassium: 3.6 mEq/L (ref 3.5–5.1)
Potassium: 3.7 mEq/L (ref 3.5–5.1)
Sodium: 137 mEq/L (ref 135–145)
Sodium: 139 mEq/L (ref 135–145)
Total Protein: 7.3 g/dL (ref 6.0–8.3)

## 2013-08-28 MED ORDER — ONDANSETRON 4 MG PO TBDP: 4.0000 mg | ORAL_TABLET | Freq: Three times a day (TID) | ORAL | Status: DC | PRN

## 2013-08-28 MED ORDER — OXYCODONE-ACETAMINOPHEN 5-325 MG PO TABS: 1.0000 | ORAL_TABLET | Freq: Four times a day (QID) | ORAL | Status: DC | PRN

## 2013-08-28 MED ORDER — ONDANSETRON 4 MG PO TBDP
8.0000 mg | ORAL_TABLET | Freq: Once | ORAL | Status: AC
  Administered 2013-08-28: 8 mg via ORAL
  Filled 2013-08-28: qty 2

## 2013-08-28 MED ORDER — GI COCKTAIL ~~LOC~~
30.0000 mL | Freq: Once | ORAL | Status: DC
  Filled 2013-08-28: qty 30

## 2013-08-28 MED ORDER — FENTANYL CITRATE 0.05 MG/ML IJ SOLN
100.0000 ug | Freq: Once | INTRAMUSCULAR | Status: AC
  Administered 2013-08-28: 100 ug via INTRAVENOUS
  Filled 2013-08-28: qty 2

## 2013-08-28 MED ORDER — OXYCODONE-ACETAMINOPHEN 5-325 MG PO TABS
1.0000 | ORAL_TABLET | Freq: Once | ORAL | Status: AC
  Administered 2013-08-28: 1 via ORAL
  Filled 2013-08-28: qty 1

## 2013-08-28 NOTE — ED Notes (Signed)
Patient is resting comfortably. 

## 2013-08-28 NOTE — ED Notes (Signed)
Unresolved n/v since Thursday. Seen here 08/26/13 for n/v. Emesis clear, brownish, green. Epigastric pain. Lower ribs hurting and radiating to back from frequent vomiting.

## 2013-08-28 NOTE — ED Provider Notes (Signed)
CSN: [id number]     Arrival date & time 08/28/13  1947 History   First MD Initiated Contact with Patient 08/28/13 2057     Chief Complaint  Patient presents with  . Abdominal Pain  . Emesis   (Consider location/radiation/quality/duration/timing/severity/associated sxs/prior Treatment) Patient is a 52 y.o. male presenting with abdominal pain. The history is provided by the patient and medical records.  Abdominal Pain Pain location:  Epigastric Pain quality: gnawing and squeezing   Pain radiates to:  Back, R flank and L flank Pain severity:  Severe Onset quality:  Gradual Duration:  3 days Timing:  Intermittent Progression:  Unchanged Chronicity:  New Context: retching   Context: not alcohol use   Relieved by:  Antacids (and narcotics) Worsened by:  Palpation and eating Ineffective treatments:  Antacids Associated symptoms: nausea and vomiting   Associated symptoms: no anorexia, no chest pain, no constipation, no cough, no diarrhea, no dysuria, no fever, no flatus, no hematuria and no shortness of breath     Past Medical History  Diagnosis Date  . Cushing syndrome    Past Surgical History  Procedure Laterality Date  . Putitary tumor removed    . Back surgery    . Hernia repair    . Hemorrhoid surgery     No family history on file. History  Substance Use Topics  . Smoking status: Former Smoker    Types: Cigars  . Smokeless tobacco: Not on file  . Alcohol Use: No    Review of Systems  Constitutional: Negative for fever.  Respiratory: Negative for cough and shortness of breath.   Cardiovascular: Negative for chest pain.  Gastrointestinal: Positive for nausea, vomiting and abdominal pain. Negative for diarrhea, constipation, anorexia and flatus.  Genitourinary: Negative for dysuria and hematuria.  Musculoskeletal: Negative for myalgias and arthralgias.  Skin: Negative for color change, pallor, rash and wound.  All other systems reviewed and are  negative.    Allergies  Codeine; Gabapentin; Hydrocodone-acetaminophen; and Penicillins  Home Medications   Current Outpatient Rx  Name  Route  Sig  Dispense  Refill  . esomeprazole (NEXIUM) 40 MG capsule   Oral   Take 1 capsule (40 mg total) by mouth daily.   30 capsule   0   . famotidine (PEPCID) 20 MG tablet   Oral   Take 20 mg by mouth daily.         . hydrocortisone (CORTEF) 10 MG tablet   Oral   Take 20 mg by mouth daily.         . sucralfate (CARAFATE) 1 G tablet   Oral   Take 1 tablet (1 g total) by mouth 4 (four) times daily. Take with meals and before bed.   60 tablet   0   . ondansetron (ZOFRAN ODT) 4 MG disintegrating tablet   Oral   Take 1 tablet (4 mg total) by mouth every 8 (eight) hours as needed for nausea.   10 tablet   0   . oxyCODONE-acetaminophen (PERCOCET/ROXICET) 5-325 MG per tablet   Oral   Take 1 tablet by mouth every 6 (six) hours as needed for pain.   10 tablet   0    BP 123/77  Pulse 77  Temp(Src) 99.5 F (37.5 C) (Oral)  Resp 20  SpO2 100% Physical Exam  Nursing note and vitals reviewed. Constitutional: He is oriented to person, place, and time. He appears well-developed and well-nourished. No distress.  HENT:  Head: Normocephalic and atraumatic.  Mouth/Throat: Oropharynx is clear and moist. No oropharyngeal exudate.  Eyes: Conjunctivae and EOM are normal. Pupils are equal, round, and reactive to light.  Neck: Normal range of motion. Neck supple.  Cardiovascular: Normal rate, regular rhythm and normal heart sounds.  Exam reveals no gallop and no friction rub.   No murmur heard. Pulmonary/Chest: Effort normal and breath sounds normal. No respiratory distress. He has no wheezes. He has no rales.  Abdominal: Soft. He exhibits no distension. There is tenderness in the right upper quadrant, epigastric area and left upper quadrant. There is no rigidity, no rebound, no guarding, no tenderness at McBurney's point and negative  Murphy's sign.  Musculoskeletal: Normal range of motion. He exhibits no edema and no tenderness.  Lymphadenopathy:    He has no cervical adenopathy.  Neurological: He is alert and oriented to person, place, and time. He has normal strength. No cranial nerve deficit or sensory deficit. Coordination and gait normal. GCS eye subscore is 4. GCS verbal subscore is 5. GCS motor subscore is 6.  Skin: Skin is warm and dry. No rash noted. He is not diaphoretic.  Psychiatric: He has a normal mood and affect. His behavior is normal. Judgment and thought content normal.    ED Course  Procedures (including critical care time) Labs Review Labs Reviewed  CBC WITH DIFFERENTIAL - Abnormal; Notable for the following:    WBC 14.0 (*)    MCV 77.5 (*)    Neutro Abs 9.9 (*)    Monocytes Absolute 1.1 (*)    All other components within normal limits  COMPREHENSIVE METABOLIC PANEL - Abnormal; Notable for the following:    Glucose, Bld 103 (*)    GFR calc non Af Amer 81 (*)    All other components within normal limits  URINALYSIS, ROUTINE W REFLEX MICROSCOPIC - Abnormal; Notable for the following:    APPearance HAZY (*)    All other components within normal limits  CBC WITH DIFFERENTIAL - Abnormal; Notable for the following:    WBC 14.6 (*)    Neutro Abs 10.7 (*)    Monocytes Absolute 1.2 (*)    All other components within normal limits  COMPREHENSIVE METABOLIC PANEL - Abnormal; Notable for the following:    Glucose, Bld 103 (*)    GFR calc non Af Amer 80 (*)    All other components within normal limits  URINALYSIS, ROUTINE W REFLEX MICROSCOPIC - Abnormal; Notable for the following:    pH 8.5 (*)    All other components within normal limits  LIPASE, BLOOD  LIPASE, BLOOD   Imaging Review Ct Abdomen Pelvis W Contrast  08/27/2013   CLINICAL DATA:  Mid abdominal pain.  EXAM: CT ABDOMEN AND PELVIS WITH CONTRAST  TECHNIQUE: Multidetector CT imaging of the abdomen and pelvis was performed using the  standard protocol following bolus administration of intravenous contrast.  CONTRAST:  OMNIPAQUE IOHEXOL 300 MG/ML  SOLN  COMPARISON:  11/24/2007  FINDINGS: Lung bases are clear. No effusions. Heart is normal size.  Liver, gallbladder, spleen, pancreas, adrenals and kidneys are normal. Appendix is visualized and is normal.  Stomach, large and small bowel are unremarkable. Aorta is normal caliber. Urinary bladder is decompressed.  No acute bony abnormality. Degenerative changes in the lumbar spine.  IMPRESSION: No acute findings in the abdomen or pelvis.   Electronically Signed   By: Charlett Nose M.D.   On: 08/27/2013 02:14   Dg Chest Portable 1 View  08/27/2013   CLINICAL DATA:  Chest pain and shortness of Breath.  EXAM: PORTABLE CHEST - 1 VIEW  COMPARISON:  08/20/2007.  FINDINGS: The cardiac silhouette, mediastinal and hilar contours are within normal limits and stable. Bronchitic type lung changes are noted an most likely related to smoking. No infiltrates, effusions or edema. The bony thorax is intact. Remote healed fractures are noted.  IMPRESSION: No acute cardiopulmonary findings.   Electronically Signed   By: Loralie Champagne M.D.   On: 08/27/2013 00:37    MDM   1. Epigastric pain   2. Nausea and vomiting     The patient is a 52 year old male with history of irritable bowel, chronic low back pain, GERD presents with nausea vomiting, abdominal pain since Thursday. He was evaluated at this institution on Thursday and evaluated with CT abdomen pelvis, full abdominal labs, urinalysis. Abnormality were noted to be mild hypokalemia at 3.3, otherwise normal labs and CT scan without findings concerning for acute abdominal process. He was again evaluated yesterday without resolution of his pain and with continued vomiting, however further workup was not undertaken at that time and symptoms resolved with symptomatic treatment. He presents today with worsening abdominal pain, epigastric and radiating to  bilateral flanks. He also endorses dry heaving with nausea and vomiting.  Denies fevers. States the last bowel movement was on Thursday morning and has had none since.  On arrival patient is afebrile and hemodynamically stable. He is well appearing and in no apparent distress. Abdominal exam with epigastric tenderness but no peritoneal signs. Based on recent CT scan as well as no peritoneal findings, feel that further imaging is not indicated in this patient would be a disservice to him. Will repeat labs as well as urinalysis. Pancreatitis remains on the differential, although he is low risk. More likely is GERD or irritable bowel. Ruled out for ACS with delta troponin yesterday and do not feel like repeating cardiac workup is indicated. He has been taking result omeprazole was prescribed 2 days ago without relief. Will evaluate for significant electrolyte disturbance, elevated lipase, urine urinalysis. Symptomatic treatment with GI cocktail and pain meds.   Date: 08/28/2013  Rate: 63  Rhythm: normal sinus rhythm  QRS Axis: normal  Intervals: normal  ST/T Wave abnormalities: normal  Conduction Disutrbances:none  Narrative Interpretation:   Old EKG Reviewed: unchanged  Labs without signs of pancreatitis. No signs of UTI. Mild leukocytosis similar to previous. CMP with unchanged lytes and no signs of hepatic or renal impairment. Feel that gastritis most likely. Needs GI follow up, however needs PCP referral. Will send home with two days of narcotics and anti-emetics. Pain free following symptomatic treatment. Will send home with omeprazole as well. No concern for acute abdominal process. Stable for d/c.  Discussed with the patient return precautions and need for follow up with PCP. Patient voiced understanding. Stable for d/c. This patient was discussed with my attending, Dr. Redgie Grayer.  Dorna Leitz, MD 08/28/13 [phone]

## 2013-08-29 NOTE — ED Provider Notes (Signed)
I saw and evaluated the patient, reviewed the resident's note and I agree with the findings and plan. I reviewed EKG.   Recurrent epigastric pain.  VSS.  Extensive w/u in past two days.  CT, EKGs and trop.  Pt is afebrile and his abdominal exam is without peritoneal signs.  WBC is elevated.  Doubt ACS, SBI, perforation or obstruction, surgical abdomen.  Pt has only been taking meds for 1 day.  Will add narcotics and antiemetics.  Offered to consult GI, but pt wants to obtain c/s through [facility].  ER precautions given.    Darlys Gales, MD 08/29/13 [phone]

## 2013-08-30 NOTE — ED Provider Notes (Signed)
Medical screening examination/treatment/procedure(s) were conducted as a shared visit with non-physician practitioner(s) and myself.  I personally evaluated the patient during the encounter  Please see my separate respective documentation pertaining to this patient encounter   Vida Roller, MD 08/30/13 1546

## 2013-09-30 ENCOUNTER — Other Ambulatory Visit: Payer: Self-pay

## 2013-12-15 LAB — CBC WITH DIFFERENTIAL/PLATELET
Basophil #: 0.2 10*3/uL — ABNORMAL HIGH (ref 0.0–0.1)
Basophil %: 1.4 %
Eosinophil #: 0.3 10*3/uL (ref 0.0–0.7)
Eosinophil %: 2 %
HCT: 43.6 % (ref 40.0–52.0)
HGB: 14.3 g/dL (ref 13.0–18.0)
LYMPHS ABS: 3.2 10*3/uL (ref 1.0–3.6)
Lymphocyte %: 21.8 %
MCH: 25.9 pg — AB (ref 26.0–34.0)
MCHC: 32.7 g/dL (ref 32.0–36.0)
MCV: 79 fL — ABNORMAL LOW (ref 80–100)
MONO ABS: 1.3 x10 3/mm — AB (ref 0.2–1.0)
Monocyte %: 9 %
NEUTROS PCT: 65.8 %
Neutrophil #: 9.7 10*3/uL — ABNORMAL HIGH (ref 1.4–6.5)
Platelet: 171 10*3/uL (ref 150–440)
RBC: 5.51 10*6/uL (ref 4.40–5.90)
RDW: 14.5 % (ref 11.5–14.5)
WBC: 14.7 10*3/uL — AB (ref 3.8–10.6)

## 2013-12-15 LAB — TROPONIN I

## 2013-12-15 LAB — COMPREHENSIVE METABOLIC PANEL
ALK PHOS: 71 U/L
ANION GAP: 7 (ref 7–16)
Albumin: 3.9 g/dL (ref 3.4–5.0)
BUN: 7 mg/dL (ref 7–18)
Bilirubin,Total: 0.5 mg/dL (ref 0.2–1.0)
CHLORIDE: 104 mmol/L (ref 98–107)
CO2: 28 mmol/L (ref 21–32)
Calcium, Total: 9 mg/dL (ref 8.5–10.1)
Creatinine: 1.12 mg/dL (ref 0.60–1.30)
EGFR (African American): >60
EGFR (Non-African Amer.): >60
Glucose: 112 mg/dL — ABNORMAL HIGH (ref 65–99)
OSMOLALITY: 276 (ref 275–301)
POTASSIUM: 3.9 mmol/L (ref 3.5–5.1)
SGOT(AST): 14 U/L — ABNORMAL LOW (ref 15–37)
SGPT (ALT): 22 U/L (ref 12–78)
Sodium: 139 mmol/L (ref 136–145)
Total Protein: 7.6 g/dL (ref 6.4–8.2)

## 2013-12-15 LAB — URINALYSIS, COMPLETE
BACTERIA: NONE SEEN
BLOOD: NEGATIVE
Bilirubin,UR: NEGATIVE
GLUCOSE, UR: NEGATIVE mg/dL (ref 0–75)
Ketone: NEGATIVE
Leukocyte Esterase: NEGATIVE
NITRITE: NEGATIVE
PROTEIN: NEGATIVE
Ph: 7 (ref 4.5–8.0)
RBC, UR: NONE SEEN /HPF (ref 0–5)
Specific Gravity: 1.018 (ref 1.003–1.030)
Squamous Epithelial: NONE SEEN

## 2013-12-15 LAB — LIPASE, BLOOD: Lipase: 153 U/L (ref 73–393)

## 2013-12-16 ENCOUNTER — Observation Stay: Payer: Self-pay | Admitting: Specialist

## 2013-12-16 LAB — LIPID PANEL
CHOLESTEROL: 154 mg/dL (ref 0–200)
HDL Cholesterol: 44 mg/dL (ref 40–60)
LDL CHOLESTEROL, CALC: 95 mg/dL (ref 0–100)
TRIGLYCERIDES: 77 mg/dL (ref 0–200)
VLDL CHOLESTEROL, CALC: 15 mg/dL (ref 5–40)

## 2013-12-17 LAB — CBC WITH DIFFERENTIAL/PLATELET
BASOS ABS: 0 10*3/uL (ref 0.0–0.1)
Basophil %: 0.4 %
Eosinophil #: 0 10*3/uL (ref 0.0–0.7)
Eosinophil %: 0.2 %
HCT: 38.1 % — ABNORMAL LOW (ref 40.0–52.0)
HGB: 12.6 g/dL — ABNORMAL LOW (ref 13.0–18.0)
Lymphocyte #: 1.9 10*3/uL (ref 1.0–3.6)
Lymphocyte %: 16.6 %
MCH: 26 pg (ref 26.0–34.0)
MCHC: 32.9 g/dL (ref 32.0–36.0)
MCV: 79 fL — ABNORMAL LOW (ref 80–100)
Monocyte #: 0.8 x10 3/mm (ref 0.2–1.0)
Monocyte %: 7 %
NEUTROS ABS: 8.9 10*3/uL — AB (ref 1.4–6.5)
NEUTROS PCT: 75.8 %
Platelet: 153 10*3/uL (ref 150–440)
RBC: 4.84 10*6/uL (ref 4.40–5.90)
RDW: 14.4 % (ref 11.5–14.5)
WBC: 11.7 10*3/uL — AB (ref 3.8–10.6)

## 2013-12-17 LAB — COMPREHENSIVE METABOLIC PANEL
ALK PHOS: 54 U/L
ALT: 22 U/L (ref 12–78)
ANION GAP: 3 — AB (ref 7–16)
Albumin: 3.2 g/dL — ABNORMAL LOW (ref 3.4–5.0)
BUN: 8 mg/dL (ref 7–18)
Bilirubin,Total: 0.8 mg/dL (ref 0.2–1.0)
Calcium, Total: 8.7 mg/dL (ref 8.5–10.1)
Chloride: 108 mmol/L — ABNORMAL HIGH (ref 98–107)
Co2: 28 mmol/L (ref 21–32)
Creatinine: 1 mg/dL (ref 0.60–1.30)
EGFR (Non-African Amer.): >60
GLUCOSE: 122 mg/dL — AB (ref 65–99)
Osmolality: 277 (ref 275–301)
POTASSIUM: 4.3 mmol/L (ref 3.5–5.1)
SGOT(AST): 20 U/L (ref 15–37)
SODIUM: 139 mmol/L (ref 136–145)
TOTAL PROTEIN: 6.7 g/dL (ref 6.4–8.2)

## 2014-01-01 ENCOUNTER — Emergency Department: Payer: Self-pay | Admitting: Emergency Medicine

## 2014-01-01 LAB — URINALYSIS, COMPLETE
BLOOD: NEGATIVE
Bilirubin,UR: NEGATIVE
Glucose,UR: NEGATIVE mg/dL (ref 0–75)
KETONE: NEGATIVE
LEUKOCYTE ESTERASE: NEGATIVE
Nitrite: NEGATIVE
PROTEIN: NEGATIVE
Ph: 6 (ref 4.5–8.0)
Specific Gravity: 1.014 (ref 1.003–1.030)

## 2015-03-18 NOTE — Consult Note (Signed)
I have seen and examined [name] and agree with Wilhelmenia BlaseKaryn Earle's a/p.  is doing well clinically today.   Suspect he is nearing discharge.  will plan to see him in clinic to discuss MRI or EUS to r/o malignancy as a cause of the pancreatitis.   now, agree with advancing diet as tolerated and then outpatient management.   Electronic Signatures: Dow Adolphein, Matthew (MD)  (Signed on 22-Jan-15 16:59)  Authored  Last Updated: 22-Jan-15 16:59 by Dow Adolphein, Matthew (MD)

## 2015-03-18 NOTE — Consult Note (Signed)
PATIENT NAME:  [name], [name] MR#:  [id number] DATE OF BIRTH:  11-07-1961  DATE OF CONSULTATION:  12/16/2013  REFERRING PHYSICIAN:  Dr. Verdell Carmine CONSULTING PHYSICIAN:  Mark Sox. Zettie Pho, PA-C dictating for Arther Dames, MD  REASON FOR CONSULTATION:  Acute pancreatitis.   ATTENDING GASTROENTEROLOGIST:  Kathrin Ruddy, MD.   HISTORY OF PRESENT ILLNESS:  This is a pleasant 54 year old gentleman who initially presented to the Emergency Room with acute onset severe epigastric and left upper quadrant abdominal pain with intractable nausea and vomiting. He had never experienced pain quite like this before and he described the pain as a 9/10 when it was at its worst. He does have a mildly elevated white blood cell count, but he does take steroids for Cushing's disease. Otherwise, his lipase level, troponins and liver enzymes were all within normal limits. When CT scan of the abdomen and pelvis was obtained, it did show some pancreatic tail inflammation and was otherwise unrevealing. Of note, he did have a recent cholecystectomy in October 2014 for gallstones. He has not had a sip of alcohol for over 30 years. His triglycerides were checked and these were within normal limits. He was admitted, kept n.p.o. for some time and was restarted on clear liquids this morning. He tolerated this without difficulty. His abdominal pain has significantly improved and his nausea and vomiting have resolved. There were no fever or chills. No chest pain or shortness of breath. No changes to his bowel habits.   PAST MEDICAL HISTORY:  Obesity and Cushing's disease.   PAST SURGICAL HISTORY: 1.  Cholecystectomy in October 2014.  2.  Back surgery x 3 and most recently with a back stimulator in place. 3.  Pituitary tumor surgery.   ALLERGIES:  CODEINE, HYDROCODONE AND PENICILLIN.   HOME MEDICATIONS:  Cortef.   SOCIAL HISTORY:  The patient denies any current alcohol, tobacco or illicit drug use. He does have a remote history of  alcohol intake but has not had a sip of alcohol for over 30 years.   FAMILY HISTORY:  No known family history of GI malignancy, colon polyps or IBD.   REVIEW OF SYSTEMS:  A 10-system review of systems review of systems was obtained on the patient. Pertinent positives are mentioned above and otherwise negative.   OBJECTIVE:  VITAL SIGNS:  Blood pressure 99/63, heart rate 63, respirations 20, temperature 97.9, bedside pulse ox 95%.  GENERAL:  This is a pleasant 54 year old gentleman resting quietly and comfortably in bed in no acute distress. Alert and oriented x 3.  HEAD:  Atraumatic, normocephalic.  NECK:  Supple. No lymphadenopathy noted.  HEENT:  Sclerae anicteric. Mucous membranes moist.  LUNGS:  Respirations are even and unlabored. Clear to auscultation bilateral anterior lung fields.  HEART:  Regular rate and rhythm. S1, S2 noted.  ABDOMEN:  Soft, nontender, nondistended. Normoactive bowel sounds noted in all 4 quadrants. No guarding or rebound. No masses, hernias or organomegaly appreciated.  RECTAL:  Deferred.  PSYCHIATRIC:  Appropriate mood and affect.  NEUROLOGIC:  Cranial nerves II through XII are grossly intact. Of note, the patient is sitting up in bed, currently tolerating a clear liquid diet without any difficulty.   LABORATORY DATA:  White blood cells 14.7, hemoglobin 14.3, hematocrit 43.6, platelets 171. Troponins negative. Lipase 153. Sodium 139, potassium 3.9, BUN 7, creatinine 1.12, glucose 112. Bilirubin 0.5, alk phos 71, AST 14, ALT 22, albumin 3.9.   IMAGING:  A CT scan of the abdomen and pelvis was obtained showing pancreatic tail  inflammation.   ASSESSMENT:  1.  Abnormal CT scan showing pancreatic tail inflammation. Questionable for acute pancreatitis though his lipase and LFTs are within normal limits.  2.  Acute onset of severe upper abdominal pain, this has resolved overnight.  3.  Intractable nausea and vomiting, this has resolved overnight. The patient is  currently tolerating a clear liquid diet without any difficulty.   PLAN:  I have discussed this patient's case in detail with Dr. Kathrin Ruddy, who is involved in the development of the patient's plan of care. We did review the patient's lab work and CT scan and it is certainly an unusual presentation of an acute pancreatitis with normal lab work and only the pancreatic tail being inflamed. However, the patient is almost entirely symptomatically improved and is tolerating a clear liquid diet without any difficulty, whatsoever. He has not had any recent alcohol use for the past 30 years and his triglycerides are normal. Recent laparoscopic cholecystectomy for gallstones was October 2014. Therefore, continue advancing his diet as tolerated, pain medication and antiemetics as needed. No further GI recommendations at the present time. We will continue to follow this patient and make further recommendations per clinical course.   Thank you so much for this consultation and for allowing [place] to participate in the patient's plan of care.   This services provided by Mark Sox. Lakynn Halvorsen, PA-C under collaborative agreement with Dr. Arther Dames.  ____________________________ Mark Sox. Avah Bashor, PA-C kme:jm D: 12/16/2013 16:06:00 ET T: 12/16/2013 16:33:07 ET JOB#: [id number]  cc: Mark Sox. Marilynn Ekstein, PA-C, <Dictator> [place] ELECTRONICALLY SIGNED 12/17/2013 8:29

## 2015-03-18 NOTE — H&P (Signed)
PATIENT NAME:  Mark ForesterSCOTT, [name] DATE OF BIRTH:  [birth year]  DATE OF ADMISSION:  12/16/2013  PRIMARY CARE PHYSICIAN: Nonlocal.   REFERRING PHYSICIAN: Rebecka ApleyAllison P. Webster, MD  CHIEF COMPLAINT: Abdominal pain in the left upper quadrant, intractable nausea and vomiting.   HISTORY OF PRESENT ILLNESS: The patient is a 54 year old Caucasian male with past medical history of Cushing syndrome and obesity, who is presenting to the ER with a chief complaint of left upper quadrant abdominal pain radiating to the epigastric area associated with intractable nausea and some degree of vomiting for the past 3 days. His left upper quadrant abdominal pain is radiating to epigastric area and also to the left flank area. He denies any fever. Denies diarrhea. No hematemesis. Denies any chest pain. He could not catch his breath as taking deep inspirations is making the left upper quadrant abdominal pain worse. His pain is cramping and aching in nature. It is 8 to 9 out of 10. No similar complaints in the past. The patient had a history of gallstones and underwent cholecystectomy in October 2014. The patient used to drink alcohol, but quit drinking approximately 32 years ago. In the ER, the patient had CAT scan of the abdomen and pelvis done which has revealed inflamed pancreatic tail. Interestingly, the patient's lipase and his LFTs are normal. The patient started feeling better after he had received Dilaudid and antinausea medication. Wife and daughter are at bedside. No similar complaints in the past.   PAST MEDICAL HISTORY: Cushing syndrome.   PAST SURGICAL HISTORY: Cholecystectomy, back stimulator, pituitary tumor surgery, spine stimulator.   ALLERGIES: THE PATIENT IS ALLERGIC TO CODEINE, HYDROCODONE, PENICILLIN.   PSYCHOSOCIAL HISTORY: Lives at home with wife. No history of smoking. Quit alcohol intake 32 years ago. Denies any illicit drug usage.   FAMILY HISTORY: Mother has history of diabetes  mellitus and multiple strokes. Father deceased with dementia.   HOME MEDICATIONS: Cortef 10 mg in the morning and 5 mg at bedtime.   REVIEW OF SYSTEMS: CONSTITUTIONAL: Denies any fever, fatigue or weakness.  EYES: Denies blurry vision, double vision.  ENT: Denies epistaxis, discharge.  RESPIRATION: Denies cough and COPD.  CARDIOVASCULAR: No chest pain or palpitations.  GASTROINTESTINAL: Complaining of severe nausea, vomiting, and denies any diarrhea. Complaining of left upper quadrant and epigastric abdominal pain. Denies any hematemesis, melena or rectal bleeding.  GENITOURINARY: No dysuria or hematuria.  ENDOCRINE: Denies polyuria, nocturia or thyroid problems. Has Cushing syndrome status post pituitary tumor resection.  HEMATOLOGIC AND LYMPHATIC: No anemia, easy bruising or bleeding.  INTEGUMENTARY: No acne, rash, lesions.  MUSCULOSKELETAL: No joint pain in the neck and shoulder. Has back stimulator. NEUROLOGIC: Denies any vertigo or ataxia.  PSYCHIATRIC: No ADD, OCD.   PHYSICAL EXAMINATION:  VITAL SIGNS: Temperature 97.4, pulse 73, respirations 16 to 18, blood pressure 100/63, pulse oximetry is 96% on room air.  GENERAL APPEARANCE: Not under acute distress. Moderately built and obese. Moonlike face. HEENT: Normocephalic, atraumatic. Pupils are equally reacting to light and accommodation. No scleral icterus. No conjunctival injection. No sinus tenderness. No postnasal drip. Dry mucous membranes. Nares are patent. Ear canals are intact.  NECK: Supple. No JVD. No thyromegaly. Range of motion is intact.  LUNGS: Clear to auscultation bilaterally. No accessory muscle usage. No anterior chest wall tenderness on palpation.  CARDIAC: S1, S2 normal. Regular rate and rhythm. No murmurs.  GASTROINTESTINAL: Soft. Bowel sounds are positive in all 4 quadrants. Positive left upper quadrant and epigastric tenderness is present.  No masses felt. No hepatosplenomegaly.  NEUROLOGIC: Awake, alert, oriented  x3. Cranial nerves II through XII are grossly intact. Motor and sensory are intact. Reflexes are 2+.  EXTREMITIES: No edema. No cyanosis. No clubbing.  SKIN: Warm to touch. Normal turgor. No rashes. No lesions.  MUSCULOSKELETAL: No joint effusion, tenderness, erythema. Peripheral pulses are 2+.   LABORATORY AND IMAGING STUDIES: Troponin less than 0.02. CBC: WBC is at 14.7, MCV 79, MCH 25.9. The rest of the CBC is normal. LFTs are normal. Chem-8 is normal except glucose which is elevated at 112. CAT scan of the abdomen and pelvis with contrast has revealed focal prominence of the tail of the pancreas with heterogeneous pattern of enhancement. Some of this appears slightly nodular, without a definite mass. Associated soft tissue inflammation seen with trace of free fluid tracking inferiorly along Gerota's fascia on the left. Findings are compatible with acute pancreatitis, though after completion of treatment, further evaluation could be considered to exclude any underlying mass. No evidence of pseudocyst formation. A 12-lead EKG: Normal sinus rhythm, normal PR and QRS interval.   ASSESSMENT AND PLAN: A 54 year old Caucasian male presenting to the ER with a chief complaint of 3-day history of left upper quadrant abdominal pain associated with intractable nausea and vomiting. He will be admitted with following assessment and plan.   1. Acute left upper quadrant abdominal pain with nausea and some degree of vomiting, probably from acute pancreatitis. Will admit him to Med/Surg floor.  Keep him n.p.o.  Provide aggressive hydration with IV fluids.  Pain management with morphine as needed.  The patient is status post cholecystectomy and quit alcohol 30 years ago.  Will check fasting lipid panel.   Repeat labs, CBC and CMP.  The patient is not on any medications which could cause pancreatitis.  2. History of Cushing syndrome. Will give him Solu-Cortef in IV form which would cover his stress-dose steroids.   3. Chronic history of obesity.  4. Will provide him gastrointestinal and deep vein thrombosis prophylaxis.   CODE STATUS: He is full code. Wife is medical power of attorney.   Diagnosis and plan of care were discussed in detail with the patient and his wife at bedside. They both verbalized understanding of the plan.   TOTAL TIME SPENT ON ADMISSION: 45 minutes.  ____________________________ Ramonita Lab, MD ag:lb D: 12/16/2013 05:00:39 ET T: 12/16/2013 05:53:21 ET JOB#: [id number]  cc: Ramonita Lab, MD, <Dictator> Primary Care Physician Ramonita Lab MD ELECTRONICALLY SIGNED 12/31/2013 7:14

## 2015-03-18 NOTE — Discharge Summary (Signed)
PATIENT NAME:  [name], [name] MR#:  [id number] DATE OF BIRTH:  1961-11-16  DATE OF ADMISSION:  12/16/2013 DATE OF DISCHARGE:  12/17/2013  For a detailed note, please take a look at the history and physical done on admission by Dr. Amado CoeGouru.   DISCHARGE DIAGNOSIS:  1.  Suspected acute pancreatitis.  2.  Possible pancreatic mass.  3.  History of Cushing syndrome.   DISCHARGE DIET: The patient is being discharged on a regular diet.   DISCHARGE ACTIVITY: As tolerated.  DISCHARGE FOLLOWUP: With Dr. Dow AdolphMatthew Rein in the next 1 to 2 weeks.   DISCHARGE MEDICATIONS: Cortef 10 mg in the morning and 5 mg at night.   CONSULTANTS DURING HOSPITAL COURSE:  Dr. Dow AdolphMatthew Rein from gastroenterology.   PERTINENT STUDIES DONE DURING THE HOSPITAL COURSE: A CT scan of the abdomen and pelvis done with contrast on January 22nd showing a focal prominence of the tail of the pancreas with heterogeneous pattern of enhancement. Some of this appears slightly nodular without definite mass. Associated soft tissue inflammation seen with trace free fluid tracking inferiorly. Findings compatible with acute pancreatitis, although after completion of treatment further evaluation could be considered to exclude an underlying mass. No evidence of pseudocyst formation.   HOSPITAL COURSE: This is a 54 year old male with medical problems as mentioned above who presented to the hospital with left lower quadrant abdominal pain, nausea, vomiting and abnormal CT scan.  1.  Suspected acute pancreatitis. This was likely the working diagnosis on admission as the patient had CT scan findings suggestive of acute pancreatitis, although his pain was more in the left lower quadrant rather than epigastric. The patient was treated supportively with IV fluid, antiemetics, pain control, and kept n.p.o. for a day. His lipase was never really elevated. He has no history of alcohol abuse, and the patient already has his gallbladder taken out, and his LFTs  were normal, his triglycerides were normal. I did obtain a GI consult who recommended continuing supportive therapy and likely outpatient MRI or endoscopic ultrasound for further evaluation for his pancreatitis. The patient's diet was slowly advanced from a clear liquid, eventually to a full liquid diet, to a regular diet which he is currently tolerating with no evidence of nausea or vomiting. He is therefore being discharged home with close follow-up with GI as an outpatient.  2.  Suspected pancreatic mass. This was based on the CT scan findings done on admission. The patient clinically does not have any evidence of jaundice or any abnormal LFTs. GI consult was obtained. They recommend getting an endoscopic ultrasound or MRI as an outpatient.  3.  History of Cushing syndrome. The patient was maintained on some stress dose steroids while in the hospital and will resume his Solu-Cortef upon discharge.   CODE STATUS: The patient is a FULL code.   TIME SPENT: 40 minutes.  ____________________________ Rolly PancakeVivek J. Cherlynn KaiserSainani, MD vjs:sb D: 12/17/2013 14:57:20 ET T: 12/17/2013 16:07:27 ET JOB#: [id number]  cc: Rolly PancakeVivek J. Cherlynn KaiserSainani, MD, <Dictator> Dow AdolphMatthew Rein, MD [place] SirenVIVEK J Adalid Beckmann MD ELECTRONICALLY SIGNED 12/22/2013 11:29

## 2016-03-27 ENCOUNTER — Encounter: Payer: Self-pay | Admitting: Emergency Medicine

## 2016-03-27 ENCOUNTER — Emergency Department
Admission: EM | Admit: 2016-03-27 | Discharge: 2016-03-28 | Disposition: A | Discharge status: Home / Self Care | Payer: Medicare Other | Attending: Emergency Medicine | Admitting: Emergency Medicine

## 2016-03-27 ENCOUNTER — Emergency Department: Payer: Medicare Other

## 2016-03-27 DIAGNOSIS — E785 Hyperlipidemia, unspecified: Secondary | ICD-10-CM | POA: Diagnosis not present

## 2016-03-27 DIAGNOSIS — I10 Essential (primary) hypertension: Secondary | ICD-10-CM | POA: Diagnosis not present

## 2016-03-27 DIAGNOSIS — R1084 Generalized abdominal pain: Secondary | ICD-10-CM | POA: Insufficient documentation

## 2016-03-27 DIAGNOSIS — E249 Cushing's syndrome, unspecified: Secondary | ICD-10-CM | POA: Diagnosis not present

## 2016-03-27 DIAGNOSIS — R197 Diarrhea, unspecified: Secondary | ICD-10-CM | POA: Insufficient documentation

## 2016-03-27 DIAGNOSIS — Z79899 Other long term (current) drug therapy: Secondary | ICD-10-CM | POA: Diagnosis not present

## 2016-03-27 DIAGNOSIS — R1110 Vomiting, unspecified: Secondary | ICD-10-CM | POA: Diagnosis present

## 2016-03-27 DIAGNOSIS — Z87891 Personal history of nicotine dependence: Secondary | ICD-10-CM | POA: Insufficient documentation

## 2016-03-27 LAB — URINALYSIS COMPLETE WITH MICROSCOPIC (ARMC ONLY)
Bacteria, UA: NONE SEEN
Bilirubin Urine: NEGATIVE
Glucose, UA: NEGATIVE mg/dL
Hgb urine dipstick: NEGATIVE
Ketones, ur: NEGATIVE mg/dL
Leukocytes, UA: NEGATIVE
Nitrite: NEGATIVE
Protein, ur: 100 mg/dL — AB
RBC / HPF: NONE SEEN RBC/hpf (ref 0–5)
Specific Gravity, Urine: 1.03 (ref 1.005–1.030)
pH: 5 (ref 5.0–8.0)

## 2016-03-27 LAB — COMPREHENSIVE METABOLIC PANEL
ALBUMIN: 4.4 g/dL (ref 3.5–5.0)
ALT: 29 U/L (ref 17–63)
ANION GAP: 11 (ref 5–15)
AST: 30 U/L (ref 15–41)
Alkaline Phosphatase: 48 U/L (ref 38–126)
BUN: 15 mg/dL (ref 6–20)
CHLORIDE: 100 mmol/L — AB (ref 101–111)
CO2: 23 mmol/L (ref 22–32)
Calcium: 8.9 mg/dL (ref 8.9–10.3)
Creatinine, Ser: 1.36 mg/dL — ABNORMAL HIGH (ref 0.61–1.24)
GFR calc Af Amer: >60 mL/min (ref >60)
GFR calc non Af Amer: 57 mL/min — ABNORMAL LOW (ref >60)
GLUCOSE: 109 mg/dL — AB (ref 65–99)
Potassium: 3.7 mmol/L (ref 3.5–5.1)
SODIUM: 134 mmol/L — AB (ref 135–145)
Total Bilirubin: 0.9 mg/dL (ref 0.3–1.2)
Total Protein: 8 g/dL (ref 6.5–8.1)

## 2016-03-27 LAB — CBC
HCT: 46.7 % (ref 40.0–52.0)
Hemoglobin: 15.1 g/dL (ref 13.0–18.0)
MCH: 25.8 pg — ABNORMAL LOW (ref 26.0–34.0)
MCHC: 32.4 g/dL (ref 32.0–36.0)
MCV: 79.7 fL — ABNORMAL LOW (ref 80.0–100.0)
Platelets: 174 10*3/uL (ref 150–440)
RBC: 5.87 MIL/uL (ref 4.40–5.90)
RDW: 14.6 % — ABNORMAL HIGH (ref 11.5–14.5)
WBC: 11.3 10*3/uL — ABNORMAL HIGH (ref 3.8–10.6)

## 2016-03-27 LAB — TROPONIN I: Troponin I: <0.03 ng/mL (ref <0.031)

## 2016-03-27 LAB — LIPASE, BLOOD: Lipase: 73 U/L — ABNORMAL HIGH (ref 11–51)

## 2016-03-27 MED ORDER — PROMETHAZINE HCL 25 MG RE SUPP: 25.0000 mg | Freq: Four times a day (QID) | RECTAL | Status: DC | PRN

## 2016-03-27 MED ORDER — HYDROCORTISONE NA SUCCINATE PF 100 MG IJ SOLR
50.0000 mg | Freq: Once | INTRAMUSCULAR | Status: AC
  Administered 2016-03-27: 50 mg via INTRAVENOUS
  Filled 2016-03-27: qty 2

## 2016-03-27 MED ORDER — SODIUM CHLORIDE 0.9 % IV BOLUS (SEPSIS)
1000.0000 mL | Freq: Once | INTRAVENOUS | Status: AC
  Administered 2016-03-27: 1000 mL via INTRAVENOUS

## 2016-03-27 MED ORDER — ONDANSETRON HCL 4 MG/2ML IJ SOLN
4.0000 mg | Freq: Once | INTRAMUSCULAR | Status: AC
  Administered 2016-03-28: 4 mg via INTRAVENOUS
  Filled 2016-03-27: qty 2

## 2016-03-27 NOTE — ED Provider Notes (Signed)
[facility] Emergency Department Provider Note  ____________________________________________  Time seen: 9:50 PM  I have reviewed the triage vital signs and the nursing notes.   HISTORY  Chief Complaint Emesis; Diarrhea; and Abdominal Pain    HPI Mark ForesterBobby E Sousa is a 55 y.o. male comes to the ED for nausea vomiting diarrhea since yesterday. [facility] diagnosed with viral gastroenteritis. However, because he is unable to take anything by mouth currently due to his nausea and vomiting, he is also unable to take his steroids which she is dependent due to hypoadrenalism. He attempted to take stress dose steroids this morning but vomited it up immediately. He tried to take Zofran at home but again vomited.  Symptoms started after lunch yesterday after which she had eaten an old sandwich. However he also has sick family members recently with similar symptoms. Has generalized abdominal aching, waxing and waning, feels worse after vomiting. Nonradiating. Mild present.   From PCP Dr. Bufford Lopearlough's clinic note today: [name] is a 55 y.o. male who presented with the following:  Problem List Items Addressed This Visit   Digestive  Viral gastroenteritis - Primary  Acute x24h. Possibly food borne given abrupt onset after eating 1d hold sandwich, but more likely viral related to granddaughter who was recently sick. No sign of acute bacterial infection on exam. Euvolemic and well appearing. As such discussed pushing fluids, water, gatorade or pedialyte. Gave zofran for 4mg  odt q8h prn #6 tabs given today. Can take tylenol PRN. Avoid nsaids and will f/u prn. Return precautions reviewed.   Relevant Medications  ondansetron (ZOFRAN-ODT) 4 MG disintegrating tablet  ondansetron (ZOFRAN-ODT) disintegrating tablet 4 mg (Completed)   Endocrine  Hypoadrenalism (RAF-HCC) (Chronic)  Chronic, on cortef 25mg  daily. In setting of  viral gastroenteritis pt has missed 24h of meds. No sign of acute renal insufficiency on exam given well appearing w normal BP. However given persistent v/d, attempted to give pt IM cortef in clinic, but this formulation was not available, nor any other steroid with both MC and GC properties. Called endo clinic to see if they could offer injection today as well and they did not have meds in stock either. Pt has never been provided with home IM dosing or teaching around IM injections. As such, after waiting in clinic for >2h and recv'g zofran ODT pt felt like he could keep down PO dosing. Discussed the following: -s/p ODT zofran in clinic -return home and take 50mg  cortef PO -if not vomiting, continue zofran q8 and cant take 50mg  stress dose of cortef tomorrow -if no vomiting tomorrow and continuing to improve, can restart home dose of 25mg  on 03/29/2016 and f/u as schedule in 1wk -if pt vomits after 50mg  cortef tonight, he will present to ED for IM cortef; would likely not need admission but MD can decide about appropriate disposition -ED precautions reviewed in depth -discussed plan to obtain teaching and home IM dosing for use in the future should such situations arise again; message sent to PCP and Endocrinologist to help coordinate     Past Medical History  Diagnosis Date  . Cushing syndrome [facility](HCC)      Patient Active Problem List   Diagnosis Date Noted  . BACK PAIN WITH RADICULOPATHY 04/05/2008  . PARESTHESIA 03/01/2008  . ERECTILE DYSFUNCTION 02/08/2008  . Pain in limb 02/08/2008  . ABDOMINAL PAIN, LEFT UPPER QUADRANT 11/24/2007  . GLUCOSE INTOLERANCE 10/12/2007  . HYPERLIPIDEMIA 10/12/2007  .  ANXIETY 10/12/2007  . HYPERTENSION 10/12/2007  . GERD 10/12/2007  . Edema 10/12/2007  . COLONIC POLYPS, HX OF 10/12/2007  . IRRITABLE BOWEL SYNDROME, HX OF 10/12/2007     Past Surgical History  Procedure Laterality Date  . Putitary tumor removed    . Back surgery    . Hernia repair     . Hemorrhoid surgery       Current Outpatient Rx  Name  Route  Sig  Dispense  Refill  . esomeprazole (NEXIUM) 40 MG capsule   Oral   Take 1 capsule (40 mg total) by mouth daily.   30 capsule   0   . famotidine (PEPCID) 20 MG tablet   Oral   Take 20 mg by mouth daily.         . hydrocortisone (CORTEF) 10 MG tablet   Oral   Take 20 mg by mouth daily.         . ondansetron (ZOFRAN ODT) 4 MG disintegrating tablet   Oral   Take 1 tablet (4 mg total) by mouth every 8 (eight) hours as needed for nausea.   10 tablet   0   . oxyCODONE-acetaminophen (PERCOCET/ROXICET) 5-325 MG per tablet   Oral   Take 1 tablet by mouth every 6 (six) hours as needed for pain.   10 tablet   0   . promethazine (PHENERGAN) 25 MG suppository   Rectal   Place 1 suppository (25 mg total) rectally every 6 (six) hours as needed for nausea.   20 suppository   0   . sucralfate (CARAFATE) 1 G tablet   Oral   Take 1 tablet (1 g total) by mouth 4 (four) times daily. Take with meals and before bed.   60 tablet   0      Allergies Codeine; Gabapentin; Hydrocodone-acetaminophen; and Penicillins   No family history on file.  Social History Social History  Substance Use Topics  . Smoking status: Former Smoker    Types: Cigars  . Smokeless tobacco: None  . Alcohol Use: No    Review of Systems  Constitutional:   No fever or chills.  Eyes:   No vision changes.  ENT:   No sore throat. No rhinorrhea. Cardiovascular:   No chest pain. Respiratory:   No dyspnea or cough. Gastrointestinal:   Generalized abdominal pain with vomiting and diarrhea.  Genitourinary:   Negative for dysuria or difficulty urinating. Musculoskeletal:   Negative for focal pain or swelling Neurological:   Negative for headaches 10-point ROS otherwise negative.  ____________________________________________   PHYSICAL EXAM:  VITAL SIGNS: ED Triage Vitals  Enc Vitals Group     BP 03/27/16 1913 112/71 mmHg      Pulse Rate 03/27/16 1913 72     Resp 03/27/16 1913 20     Temp 03/27/16 1913 98 F (36.7 C)     Temp Source 03/27/16 1913 Oral     SpO2 03/27/16 1913 98 %     Weight 03/27/16 1913 237 lb (107.502 kg)     Height 03/27/16 1913  (1.854 m)     Head Cir --      Peak Flow --      Pain Score 03/27/16 1912 3     Pain Loc --      Pain Edu? --      Excl. in GC? --     Vital signs reviewed, nursing assessments reviewed.   Constitutional:   Alert and oriented. Well appearing and  in no distress. Eyes:   No scleral icterus. No conjunctival pallor. PERRL. EOMI.  No nystagmus. ENT   Head:   Normocephalic and atraumatic.   Nose:   No congestion/rhinnorhea. No septal hematoma   Mouth/Throat:   Dry mucous membranes, no pharyngeal erythema. No peritonsillar mass.    Neck:   No stridor. No SubQ emphysema. No meningismus. Hematological/Lymphatic/Immunilogical:   No cervical lymphadenopathy. Cardiovascular:   RRR. Symmetric bilateral radial and DP pulses.  No murmurs.  Respiratory:   Normal respiratory effort without tachypnea nor retractions. Breath sounds are clear and equal bilaterally. No wheezes/rales/rhonchi. Gastrointestinal:   Soft and nontender. Non distended. There is no CVA tenderness.  No rebound, rigidity, or guarding. Genitourinary:   deferred Musculoskeletal:   Nontender with normal range of motion in all extremities. No joint effusions.  No lower extremity tenderness.  No edema. Neurologic:   Normal speech and language.  CN 2-10 normal. Motor grossly intact. No gross focal neurologic deficits are appreciated.  Skin:    Skin is warm, dry and intact. No rash noted.  No petechiae, purpura, or bullae.  ____________________________________________    LABS (pertinent positives/negatives) (all labs ordered are listed, but only abnormal results are displayed) Labs Reviewed  LIPASE, BLOOD - Abnormal; Notable for the following:    Lipase 73 (*)    All other components  within normal limits  COMPREHENSIVE METABOLIC PANEL - Abnormal; Notable for the following:    Sodium 134 (*)    Chloride 100 (*)    Glucose, Bld 109 (*)    Creatinine, Ser 1.36 (*)    GFR calc non Af Amer 57 (*)    All other components within normal limits  CBC - Abnormal; Notable for the following:    WBC 11.3 (*)    MCV 79.7 (*)    MCH 25.8 (*)    RDW 14.6 (*)    All other components within normal limits  URINALYSIS COMPLETEWITH MICROSCOPIC ([facility] ONLY) - Abnormal; Notable for the following:    Color, Urine AMBER (*)    APPearance CLEAR (*)    Protein, ur 100 (*)    Squamous Epithelial / LPF 0-5 (*)    All other components within normal limits  TROPONIN I   ____________________________________________   EKG  Interpreted by me Normal sinus rhythm rate of 76, normal axis intervals QRS ST segments and T waves.  ____________________________________________    RADIOLOGY  Two-view abdominal x-ray nonobstructive. Consistent with gastroenteritis.  ____________________________________________   PROCEDURES   ____________________________________________   INITIAL IMPRESSION / ASSESSMENT AND PLAN / ED COURSE  Pertinent labs & imaging results that were available during my care of the patient were reviewed by me and considered in my medical decision making (see chart for details).  Patient well appearing no acute distress. Vital signs normal. Due to his steroid dependence, we'll give IV fluids and antiemetics until he can tolerate oral intake. IV Solu-Cortef stress dose here. X-ray negative, labs negative. By mouth trial pending.     ____________________________________________   FINAL CLINICAL IMPRESSION(S) / ED DIAGNOSES  Final diagnoses:  Generalized abdominal pain  Vomiting and diarrhea       Portions of this note were generated with dragon dictation software. Dictation errors may occur despite best attempts at proofreading.   Sharman Cheek,  MD 03/28/16 [id number]

## 2016-03-27 NOTE — ED Notes (Addendum)
Patient ambulatory to triage with steady gait, without difficulty or distress noted; pt reports here N/V/D since yesterday; seen by PCP today, hx Cushings and needs cortisone per MD; st unable to keep PO med down and was told to come in for injection; st generalized abd pain; received zofran in office without relief

## 2016-03-27 NOTE — Discharge Instructions (Signed)
Abdominal Pain, Adult °Many things can cause abdominal pain. Usually, abdominal pain is not caused by a disease and will improve without treatment. It can often be observed and treated at home. Your health care provider will do a physical exam and possibly order blood tests and X-rays to help determine the seriousness of your pain. However, in many cases, more time must pass before a clear cause of the pain can be found. Before that point, your health care provider may not know if you need more testing or further treatment. °HOME CARE INSTRUCTIONS °Monitor your abdominal pain for any changes. The following actions may help to alleviate any discomfort you are experiencing: °· Only take over-the-counter or prescription medicines as directed by your health care provider. °· Do not take laxatives unless directed to do so by your health care provider. °· Try a clear liquid diet (broth, tea, or water) as directed by your health care provider. Slowly move to a bland diet as tolerated. °SEEK MEDICAL CARE IF: °· You have unexplained abdominal pain. °· You have abdominal pain associated with nausea or diarrhea. °· You have pain when you urinate or have a bowel movement. °· You experience abdominal pain that wakes you in the night. °· You have abdominal pain that is worsened or improved by eating food. °· You have abdominal pain that is worsened with eating fatty foods. °· You have a fever. °SEEK IMMEDIATE MEDICAL CARE IF: °· Your pain does not go away within 2 hours. °· You keep throwing up (vomiting). °· Your pain is felt only in portions of the abdomen, such as the right side or the left lower portion of the abdomen. °· You pass bloody or black tarry stools. °MAKE SURE YOU: °· Understand these instructions. °· Will watch your condition. °· Will get help right away if you are not doing well or get worse. °  °This information is not intended to replace advice given to you by your health care provider. Make sure you discuss  any questions you have with your health care provider. °  °Document Released: 08/21/2005 Document Revised: 08/02/2015 Document Reviewed: 07/21/2013 °Elsevier Interactive Patient Education ©2016 [employer] ° °Diarrhea °Diarrhea is frequent loose and watery bowel movements. It can cause you to feel weak and dehydrated. Dehydration can cause you to become tired and thirsty, have a dry mouth, and have decreased urination that often is dark yellow. Diarrhea is a sign of another problem, most often an infection that will not last long. In most cases, diarrhea typically lasts 2-3 days. However, it can last longer if it is a sign of something more serious. It is important to treat your diarrhea as directed by your caregiver to lessen or prevent future episodes of diarrhea. °CAUSES  °Some common causes include: °· Gastrointestinal infections caused by viruses, bacteria, or parasites. °· Food poisoning or food allergies. °· Certain medicines, such as antibiotics, chemotherapy, and laxatives. °· Artificial sweeteners and fructose. °· Digestive disorders. °HOME CARE INSTRUCTIONS °· Ensure adequate fluid intake (hydration): Have 1 cup (8 oz) of fluid for each diarrhea episode. Avoid fluids that contain simple sugars or sports drinks, fruit juices, whole milk products, and sodas. Your urine should be clear or pale yellow if you are drinking enough fluids. Hydrate with an oral rehydration solution that you can purchase at pharmacies, retail stores, and online. You can prepare an oral rehydration solution at home by mixing the following ingredients together: °·  - tsp table salt. °· ¾ tsp baking soda. °·    tsp salt substitute containing potassium chloride. °· 1  tablespoons sugar. °· 1 L (34 oz) of water. °· Certain foods and beverages may increase the speed at which food moves through the gastrointestinal (GI) tract. These foods and beverages should be avoided and include: °· Caffeinated and alcoholic beverages. °· High-fiber  foods, such as raw fruits and vegetables, nuts, seeds, and whole grain breads and cereals. °· Foods and beverages sweetened with sugar alcohols, such as xylitol, sorbitol, and mannitol. °· Some foods may be well tolerated and may help thicken stool including: °· Starchy foods, such as rice, toast, pasta, low-sugar cereal, oatmeal, grits, baked potatoes, crackers, and bagels. °· Bananas. °· Applesauce. °· Add probiotic-rich foods to help increase healthy bacteria in the GI tract, such as yogurt and fermented milk products. °· Wash your hands well after each diarrhea episode. °· Only take over-the-counter or prescription medicines as directed by your caregiver. °· Take a warm bath to relieve any burning or pain from frequent diarrhea episodes. °SEEK IMMEDIATE MEDICAL CARE IF:  °· You are unable to keep fluids down. °· You have persistent vomiting. °· You have blood in your stool, or your stools are black and tarry. °· You do not urinate in 6-8 hours, or there is only a small amount of very dark urine. °· You have abdominal pain that increases or localizes. °· You have weakness, dizziness, confusion, or light-headedness. °· You have a severe headache. °· Your diarrhea gets worse or does not get better. °· You have a fever or persistent symptoms for more than 2-3 days. °· You have a fever and your symptoms suddenly get worse. °MAKE SURE YOU:  °· Understand these instructions. °· Will watch your condition. °· Will get help right away if you are not doing well or get worse. °  °This information is not intended to replace advice given to you by your health care provider. Make sure you discuss any questions you have with your health care provider. °  °Document Released: 11/01/2002 Document Revised: 12/02/2014 Document Reviewed: 07/19/2012 °Elsevier Interactive Patient Education ©2016 [employer] ° °Nausea and Vomiting °Nausea is a sick feeling that often comes before throwing up (vomiting). Vomiting is a reflex where  stomach contents come out of your mouth. Vomiting can cause severe loss of body fluids (dehydration). Children and elderly adults can become dehydrated quickly, especially if they also have diarrhea. Nausea and vomiting are symptoms of a condition or disease. It is important to find the cause of your symptoms. °CAUSES  °· Direct irritation of the stomach lining. This irritation can result from increased acid production (gastroesophageal reflux disease), infection, food poisoning, taking certain medicines (such as nonsteroidal anti-inflammatory drugs), alcohol use, or tobacco use. °· Signals from the brain. These signals could be caused by a headache, heat exposure, an inner ear disturbance, increased pressure in the brain from injury, infection, a tumor, or a concussion, pain, emotional stimulus, or metabolic problems. °· An obstruction in the gastrointestinal tract (bowel obstruction). °· Illnesses such as diabetes, hepatitis, gallbladder problems, appendicitis, kidney problems, cancer, sepsis, atypical symptoms of a heart attack, or eating disorders. °· Medical treatments such as chemotherapy and radiation. °· Receiving medicine that makes you sleep (general anesthetic) during surgery. °DIAGNOSIS °Your caregiver may ask for tests to be done if the problems do not improve after a few days. Tests may also be done if symptoms are severe or if the reason for the nausea and vomiting is not clear. Tests may include: °· Urine tests. °·   Blood tests. °· Stool tests. °· Cultures (to look for evidence of infection). °· X-rays or other imaging studies. °Test results can help your caregiver make decisions about treatment or the need for additional tests. °TREATMENT °You need to stay well hydrated. Drink frequently but in small amounts. You may wish to drink water, sports drinks, clear broth, or eat frozen ice pops or gelatin dessert to help stay hydrated. When you eat, eating slowly may help prevent nausea. There are also some  antinausea medicines that may help prevent nausea. °HOME CARE INSTRUCTIONS  °· Take all medicine as directed by your caregiver. °· If you do not have an appetite, do not force yourself to eat. However, you must continue to drink fluids. °· If you have an appetite, eat a normal diet unless your caregiver tells you differently. °¨ Eat a variety of complex carbohydrates (rice, wheat, potatoes, bread), lean meats, yogurt, fruits, and vegetables. °¨ Avoid high-fat foods because they are more difficult to digest. °· Drink enough water and fluids to keep your urine clear or pale yellow. °· If you are dehydrated, ask your caregiver for specific rehydration instructions. Signs of dehydration may include: °¨ Severe thirst. °¨ Dry lips and mouth. °¨ Dizziness. °¨ Dark urine. °¨ Decreasing urine frequency and amount. °¨ Confusion. °¨ Rapid breathing or pulse. °SEEK IMMEDIATE MEDICAL CARE IF:  °· You have blood or brown flecks (like coffee grounds) in your vomit. °· You have black or bloody stools. °· You have a severe headache or stiff neck. °· You are confused. °· You have severe abdominal pain. °· You have chest pain or trouble breathing. °· You do not urinate at least once every 8 hours. °· You develop cold or clammy skin. °· You continue to vomit for longer than 24 to 48 hours. °· You have a fever. °MAKE SURE YOU:  °· Understand these instructions. °· Will watch your condition. °· Will get help right away if you are not doing well or get worse. °  °This information is not intended to replace advice given to you by your health care provider. Make sure you discuss any questions you have with your health care provider. °  °Document Released: 11/11/2005 Document Revised: 02/03/2012 Document Reviewed: 04/10/2011 °Elsevier Interactive Patient Education ©2016 [employer]. ° °

## 2016-03-28 NOTE — ED Notes (Signed)
Patient tolerated PO challenge well. No further needs at this time. Provider made aware.

## 2016-12-02 ENCOUNTER — Emergency Department: Payer: Medicare Other

## 2016-12-02 ENCOUNTER — Emergency Department
Admission: EM | Admit: 2016-12-02 | Discharge: 2016-12-02 | Disposition: A | Discharge status: Home / Self Care | Payer: Medicare Other | Attending: Emergency Medicine | Admitting: Emergency Medicine

## 2016-12-02 DIAGNOSIS — I10 Essential (primary) hypertension: Secondary | ICD-10-CM | POA: Insufficient documentation

## 2016-12-02 DIAGNOSIS — R1013 Epigastric pain: Secondary | ICD-10-CM | POA: Diagnosis not present

## 2016-12-02 DIAGNOSIS — Z79899 Other long term (current) drug therapy: Secondary | ICD-10-CM | POA: Insufficient documentation

## 2016-12-02 DIAGNOSIS — Z87891 Personal history of nicotine dependence: Secondary | ICD-10-CM | POA: Insufficient documentation

## 2016-12-02 DIAGNOSIS — Z791 Long term (current) use of non-steroidal anti-inflammatories (NSAID): Secondary | ICD-10-CM | POA: Diagnosis not present

## 2016-12-02 DIAGNOSIS — R0789 Other chest pain: Secondary | ICD-10-CM | POA: Insufficient documentation

## 2016-12-02 LAB — BASIC METABOLIC PANEL
Anion gap: 6 (ref 5–15)
BUN: 12 mg/dL (ref 6–20)
CALCIUM: 9.2 mg/dL (ref 8.9–10.3)
CHLORIDE: 105 mmol/L (ref 101–111)
CO2: 27 mmol/L (ref 22–32)
CREATININE: 1.12 mg/dL (ref 0.61–1.24)
GFR calc non Af Amer: >60 mL/min (ref >60)
Glucose, Bld: 106 mg/dL — ABNORMAL HIGH (ref 65–99)
Potassium: 3.4 mmol/L — ABNORMAL LOW (ref 3.5–5.1)
Sodium: 138 mmol/L (ref 135–145)

## 2016-12-02 LAB — HEPATIC FUNCTION PANEL
ALK PHOS: 49 U/L (ref 38–126)
ALT: 16 U/L — ABNORMAL LOW (ref 17–63)
AST: 22 U/L (ref 15–41)
Albumin: 4.1 g/dL (ref 3.5–5.0)
BILIRUBIN DIRECT: 0.1 mg/dL (ref 0.1–0.5)
BILIRUBIN INDIRECT: 1 mg/dL — AB (ref 0.3–0.9)
BILIRUBIN TOTAL: 1.1 mg/dL (ref 0.3–1.2)
Total Protein: 7.1 g/dL (ref 6.5–8.1)

## 2016-12-02 LAB — CBC
HCT: 44.4 % (ref 40.0–52.0)
Hemoglobin: 14.9 g/dL (ref 13.0–18.0)
MCH: 26.4 pg (ref 26.0–34.0)
MCHC: 33.6 g/dL (ref 32.0–36.0)
MCV: 78.7 fL — AB (ref 80.0–100.0)
PLATELETS: 190 10*3/uL (ref 150–440)
RBC: 5.65 MIL/uL (ref 4.40–5.90)
RDW: 14.6 % — ABNORMAL HIGH (ref 11.5–14.5)
WBC: 8.6 10*3/uL (ref 3.8–10.6)

## 2016-12-02 LAB — TROPONIN I

## 2016-12-02 LAB — LIPASE, BLOOD: Lipase: 22 U/L (ref 11–51)

## 2016-12-02 MED ORDER — FAMOTIDINE 40 MG PO TABS: 40.0000 mg | ORAL_TABLET | Freq: Every evening | ORAL | 1 refills | Status: DC

## 2016-12-02 MED ORDER — GI COCKTAIL ~~LOC~~
30.0000 mL | Freq: Once | ORAL | Status: AC
  Administered 2016-12-02: 30 mL via ORAL
  Filled 2016-12-02: qty 30

## 2016-12-02 MED ORDER — PENTAFLUOROPROP-TETRAFLUOROETH EX AERO
INHALATION_SPRAY | CUTANEOUS | Status: AC
  Filled 2016-12-02: qty 30

## 2016-12-02 NOTE — ED Provider Notes (Addendum)
[facility] Emergency Department Provider Note  ____________________________________________   I have reviewed the triage vital signs and the nursing notes.   HISTORY  Chief Complaint Chest Pain    HPI [name] is a 56 y.o. male with a history of chronic epigastric abdominal pain, who has had multiple workups for this, also has a history of irritable bowel syndrome, hyperlipidemia anxiety hypertension, chronic back pain, history of cholecystectomy, history of Cushing's syndrome presents today with epigastric abdominal pain which radiates "everywhere". He is not actually short of breath but sometimes the pain takes his breath away. This feels like multiple other prior episodes of pancreatitis and similar upper GI issues that he has had in the past. He denies any fever or chills nausea or vomiting. He has not had any melena or bright red blood per rectum. He has not had any numbness or weakness. The pain is nonexertional. His been coming and going for 2 days. It woke him up this morning at 3:00 in the morning. He has no pleuritic chest pain. He has no legs pain or swelling or recent surgery recent travel or personal or family history of PE or DVT. He has a sharp gripping discomfort which is in his epigastric region as he points to it.     Past Medical History:  Diagnosis Date  . Cushing syndrome [facility])     Patient Active Problem List   Diagnosis Date Noted  . BACK PAIN WITH RADICULOPATHY 04/05/2008  . PARESTHESIA 03/01/2008  . ERECTILE DYSFUNCTION 02/08/2008  . Pain in limb 02/08/2008  . ABDOMINAL PAIN, LEFT UPPER QUADRANT 11/24/2007  . GLUCOSE INTOLERANCE 10/12/2007  . HYPERLIPIDEMIA 10/12/2007  . ANXIETY 10/12/2007  . HYPERTENSION 10/12/2007  . GERD 10/12/2007  . Edema 10/12/2007  . COLONIC POLYPS, HX OF 10/12/2007  . IRRITABLE BOWEL SYNDROME, HX OF 10/12/2007    Past Surgical History:  Procedure Laterality Date  . BACK SURGERY    . HEMORRHOID  SURGERY    . HERNIA REPAIR    . putitary tumor removed      Prior to Admission medications   Medication Sig Start Date End Date Taking? Authorizing Provider  hydrocortisone (CORTEF) 10 MG tablet Take 5-10 mg by mouth 3 (three) times daily. Take one tablet by mouth in the morning, take one- half tablet by mouth at lunch and one-half tablet by mouth in the early evening.   Yes Historical Provider, MD  meloxicam (MOBIC) 15 MG tablet Take 15 mg by mouth daily. 11/07/16  Yes Historical Provider, MD  tamsulosin (FLOMAX) 0.4 MG CAPS capsule Take 1 capsule by mouth daily. 02/15/16  Yes Historical Provider, MD  topiramate (TOPAMAX) 50 MG tablet Take 50 mg by mouth 2 (two) times daily. 11/22/16  Yes Historical Provider, MD  traMADol (ULTRAM) 50 MG tablet Take 25-50 mg by mouth 2 (two) times daily as needed for pain. 11/22/16  Yes Historical Provider, MD  LYRICA 25 MG capsule Take 25 mg by mouth daily. 09/27/16   Historical Provider, MD  ondansetron (ZOFRAN ODT) 4 MG disintegrating tablet Take 1 tablet (4 mg total) by mouth every 8 (eight) hours as needed for nausea. Patient not taking: Reported on 12/02/2016 08/28/13   Dorna Leitz, MD  promethazine (PHENERGAN) 25 MG suppository Place 1 suppository (25 mg total) rectally every 6 (six) hours as needed for nausea. Patient not taking: Reported on 12/02/2016 03/27/16   Sharman Cheek, MD    Allergies Codeine; Gabapentin; Hydrocodone-acetaminophen; and Penicillins  No family history  on file.  Social History Social History  Substance Use Topics  . Smoking status: Former Smoker    Types: Cigars  . Smokeless tobacco: Not on file  . Alcohol use No    Review of Systems Constitutional: No fever/chills Eyes: No visual changes. ENT: No sore throat. No stiff neck no neck pain Cardiovascular: Denies chest pain. What the patient is describing as chest pain is actually epigastric discomfort which he identifies by pointing. Respiratory: Denies shortness of  breath. Gastrointestinal:   no vomiting.  No diarrhea.  No constipation. Genitourinary: Negative for dysuria. Musculoskeletal: Negative lower extremity swelling Skin: Negative for rash. Neurological: Negative for severe headaches, focal weakness or numbness. 10-point ROS otherwise negative.  ____________________________________________   PHYSICAL EXAM:  VITAL SIGNS: ED Triage Vitals [12/02/16 0551]  Enc Vitals Group     BP (!) 132/92     Pulse Rate 72     Resp 18     Temp 97.8 F (36.6 C)     Temp Source Oral     SpO2 98 %     Weight 241 lb (109.3 kg)     Height 6\' 1"  (1.854 m)     Head Circumference      Peak Flow      Pain Score 7     Pain Loc      Pain Edu?      Excl. in GC?     Constitutional: Alert and oriented. Well appearing and in no acute distress. Eyes: Conjunctivae are normal. PERRL. EOMI. Head: Atraumatic. Nose: No congestion/rhinnorhea. Mouth/Throat: Mucous membranes are moist.  Oropharynx non-erythematous. Neck: No stridor.   Nontender with no meningismus Cardiovascular: Normal rate, regular rhythm. Grossly normal heart sounds.  Good peripheral circulation. Respiratory: Normal respiratory effort.  No retractions. Lungs CTAB. Abdominal: Reproducible epigastric discomfort, when I touch this area patient states "ouch that's the pain right there". Mild tenderness also noted left upper quadrant. No distention. No guarding no rebound, nonsurgical abdomen Back:  There is no focal tenderness or step off.  there is no midline tenderness there are no lesions noted. there is no CVA tenderness Musculoskeletal: No lower extremity tenderness, no upper extremity tenderness. No joint effusions, no DVT signs strong distal pulses no edema Neurologic:  Normal speech and language. No gross focal neurologic deficits are appreciated.  Skin:  Skin is warm, dry and intact. No rash noted. Psychiatric: Mood and affect are normal. Speech and behavior are  normal.  ____________________________________________   LABS (all labs ordered are listed, but only abnormal results are displayed)  Labs Reviewed  BASIC METABOLIC PANEL - Abnormal; Notable for the following:       Result Value   Potassium 3.4 (*)    Glucose, Bld 106 (*)    All other components within normal limits  CBC - Abnormal; Notable for the following:    MCV 78.7 (*)    RDW 14.6 (*)    All other components within normal limits  HEPATIC FUNCTION PANEL - Abnormal; Notable for the following:    ALT 16 (*)    Indirect Bilirubin 1.0 (*)    All other components within normal limits  TROPONIN I  LIPASE, BLOOD  TROPONIN I   ____________________________________________  EKG  I personally interpreted any EKGs ordered by me or triage Limited by patient's TENS unit which she states he cannot turn off, rate of 68, no acute ischemic changes noted, sinus rhythm, no acute ischemic changes  Mild bradycardia noted normal axis no acute  ST elevation or depression, ?long at 107, no acute ST elevation no acute ST depression in no acute ischemic changes noted. ____________________________________________  RADIOLOGY  I reviewed any imaging ordered by me or triage that were performed during my shift and, if possible, patient and/or family made aware of any abnormal findings. ____________________________________________   PROCEDURES  Procedure(s) performed: None  Procedures  Critical Care performed: None  ____________________________________________   INITIAL IMPRESSION / ASSESSMENT AND PLAN / ED COURSE  Pertinent labs & imaging results that were available during my care of the patient were reviewed by me and considered in my medical decision making (see chart for details).  Patient here with reproducible epigastric and somewhat in left upper quadrant pain which appears to be chronic. He has had extensive workup of this in the past. He does appear to all bowel syndrome. He does  not actually have any chest pain he has reproduce will epigastric pain. I do not have a AAA or any acute intra-abdominal pathology of that Friday. No evidence of vascular pathology do not suspect dissection or PE. We will give him pain medications, orally, we will recheck a troponin, liver function tests are reassuring, white count reassuring troponin is reassuring EKG is somewhat limited of course we'll repeat that we have asked them to see if they can turn off the patient's TENS unit and they're working on it there is apparently a remote that he has at home for it. Chest x-ray is normal. We will reassess patient although at this time a serial troponin I think is a good start for him as I don't see any clear evidence of eye threatening pathology with reproducible chronic left upper quadrant and epigastric discomfort.  ----------------------------------------- 9:25 AM on 12/02/2016 -----------------------------------------  Patient has had chronic epigastric abdominal pain of this variety. His been going back at least 3 years. He has had endoscopies for this she has had CT scans for this, he had a cardiac workup, at this time, he is very well-appearing. I did obtain an EKG with his TENS unit off, it is normal. He is feeling much better after GI cocktail. Normally, I don't do serial enzymes reproducible epigastric abdominal discomfort but because he states it radiates "everywhere" we will obtain a second troponin although very reassured by the first negative troponin despite pain since last night.  ----------------------------------------- 11:22 AM on 12/02/2016 -----------------------------------------  Patient is pain-free after GI cocktail. He states this is the same pain he has had "for years and years", he has had an endoscopy for this. He was put on antacids for this which controlled it well for many years but he went off them. I've advised him to retake his anti-asked. I did describe to the patient  and family the radiation risks involved with CAT scan and they prefer to avoid that. I do not think that is unreasonable as the patient has had chronic recurrent abdominal pain at this time has a completely benign exam. Workup is otherwise reassuring. We will discharge him with close outpatient follow-up and return precautions.  Clinical Course    ____________________________________________   FINAL CLINICAL IMPRESSION(S) / ED DIAGNOSES  Final diagnoses:  None      This chart was dictated using voice recognition software.  Despite best efforts to proofread,  errors can occur which can change meaning.      Jeanmarie PlantJames A Louden Houseworth, MD 12/02/16 0825    Jeanmarie PlantJames A Kveon Casanas, MD 12/02/16 [id number]    Jeanmarie PlantJames A Gwenn Teodoro, MD 12/02/16 [phone]

## 2016-12-02 NOTE — ED Triage Notes (Signed)
Patient reports chest pain off/on Saturday and resolved on its own.  States woke him this morning and hurt worse and feels short of breath. Patient points to epigastric area radiating into left upper quad.  States feels pain between shoulders and up back of his neck.

## 2018-04-15 IMAGING — CR DG ABDOMEN 2V
1 series · 3 of 3 positions shown · non-contrast
Comparison: CT abdomen and pelvis 12/16/2013

CLINICAL DATA: Nausea, vomiting, and diarrhea since yesterday. Seen
by primary care physician today. Generalized abdominal pain.

EXAM:
ABDOMEN - 2 VIEW

[Series 1: w abdomen upright · 0.14mm/px · 3 of 3 slices shown]
[im 1/3]
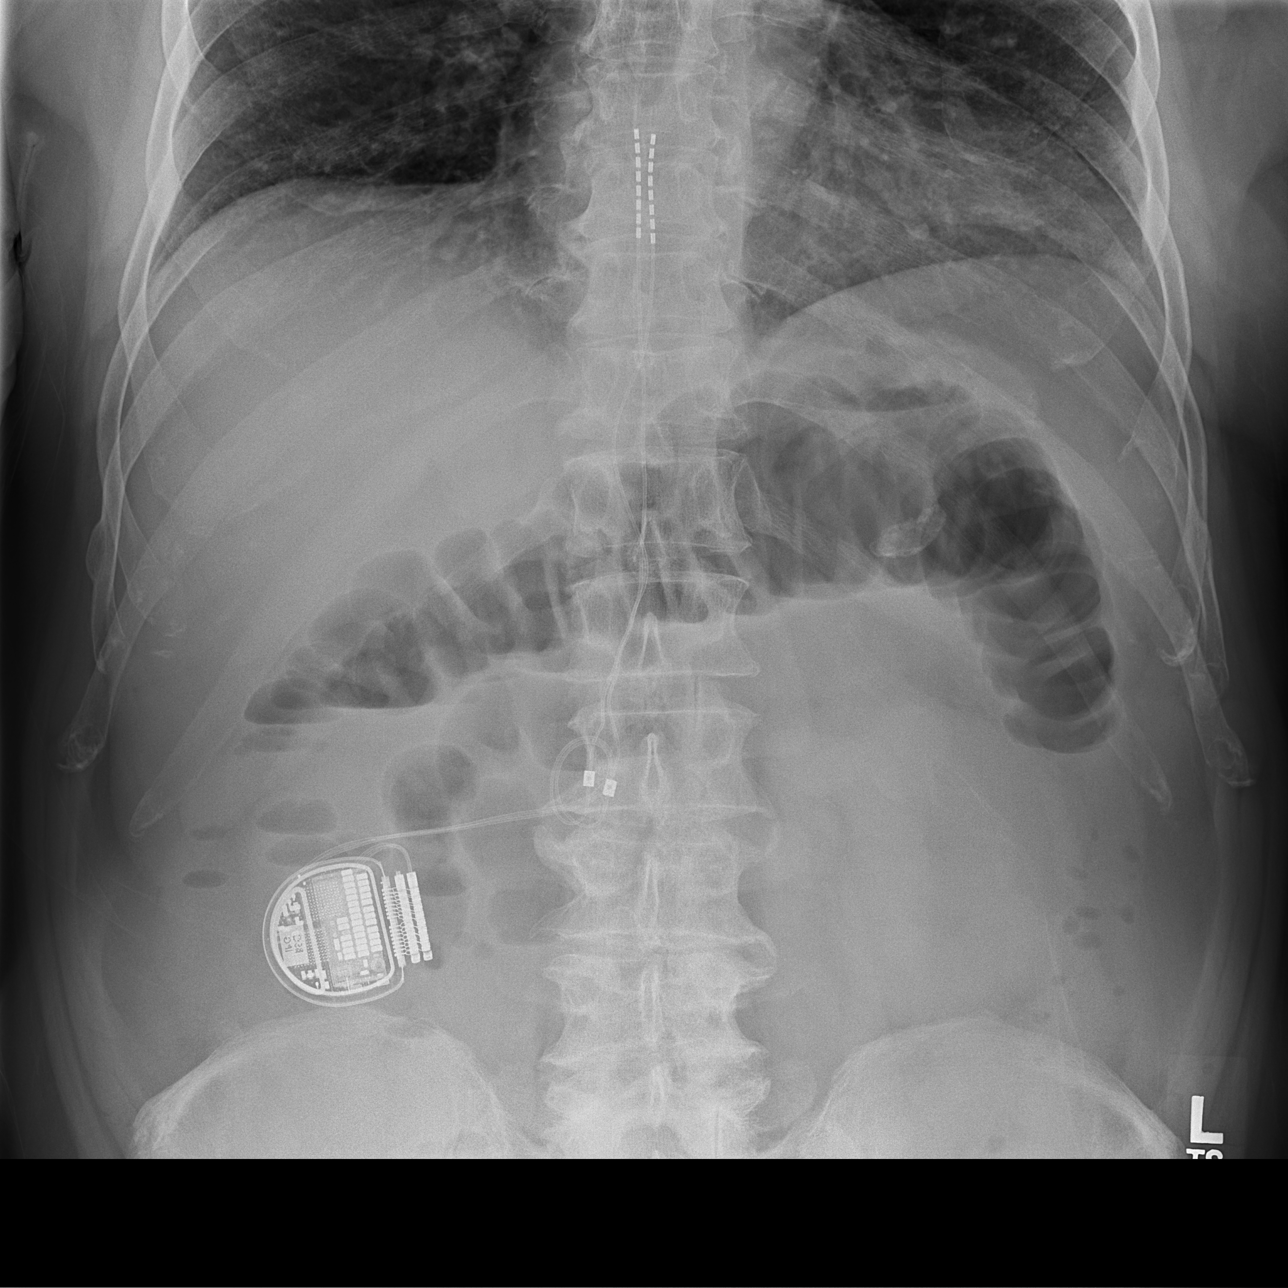
[im 2/3]
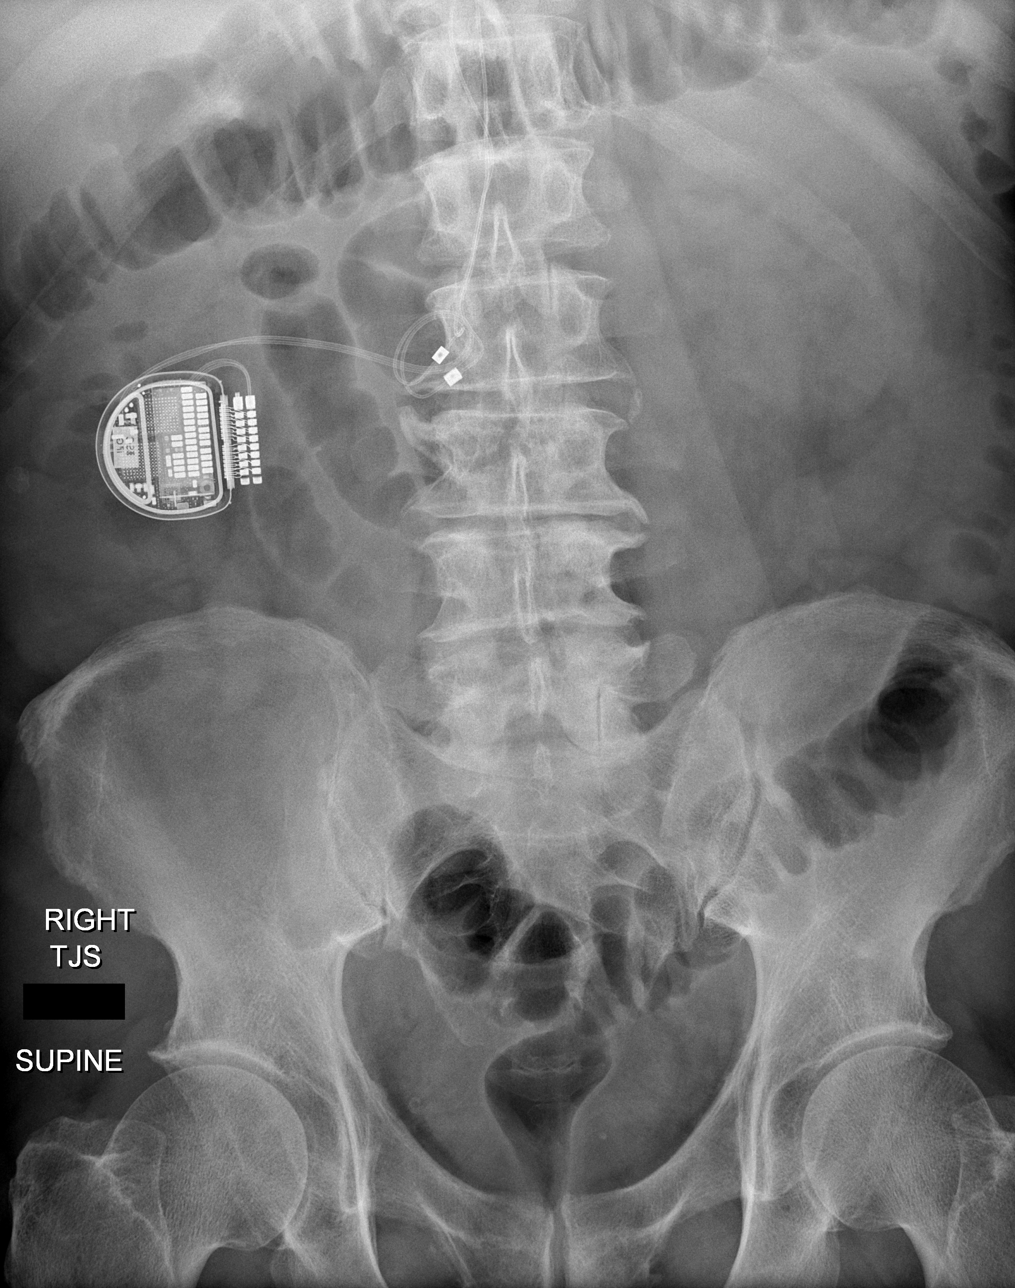
[im 3/3]
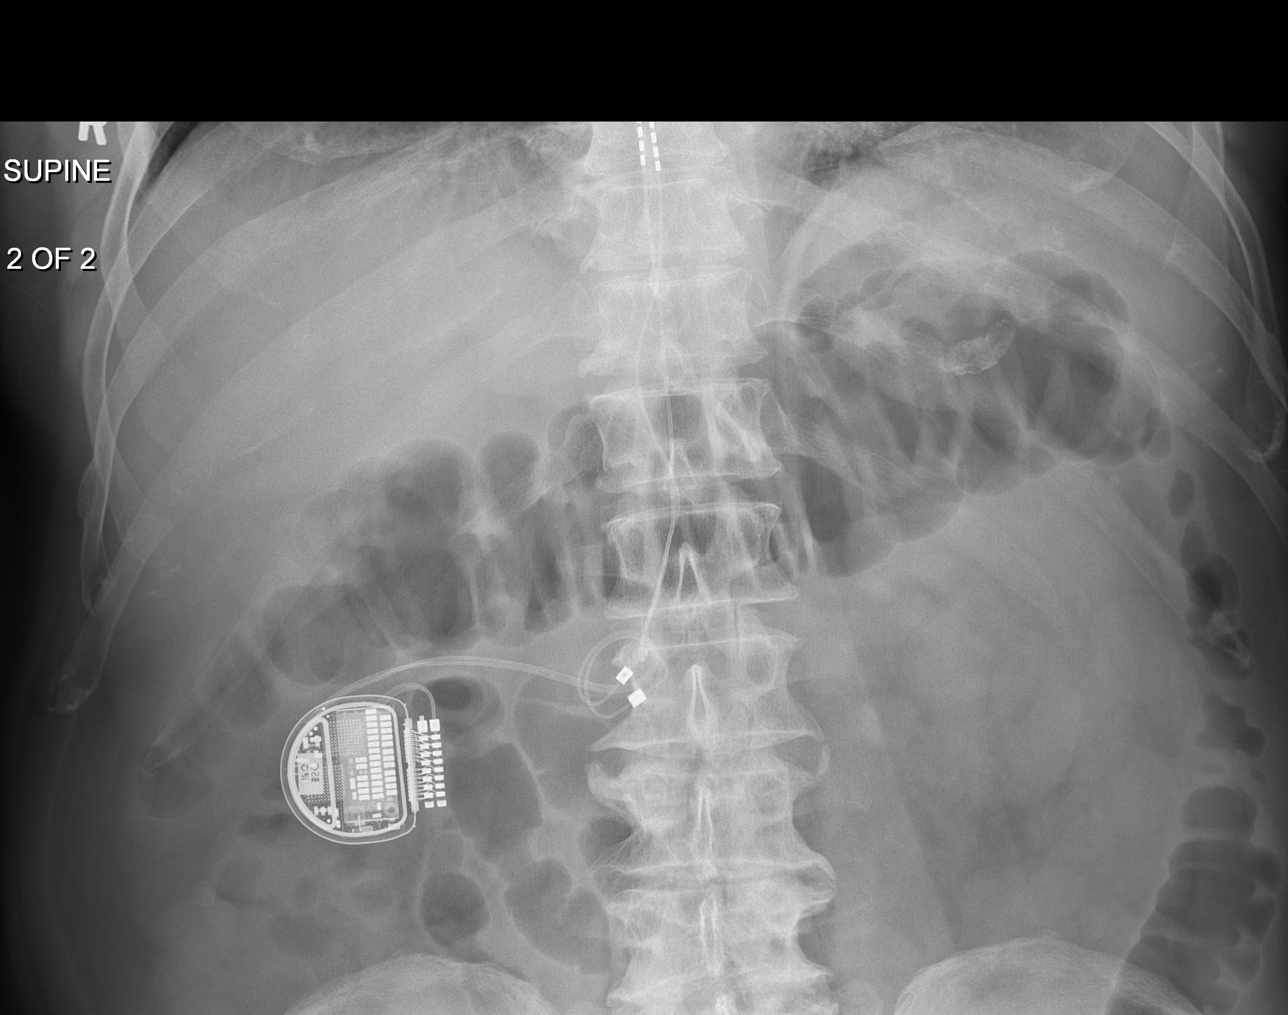

[3 of 3 positions shown; findings below may reference images not displayed]

FINDINGS: Stimulator device present in the lower thoracic spine. Gas-filled
nondistended small and large bowel. A few air-fluid levels
demonstrated in the colon. Changes likely to represent ileus or
enterocolitis. No evidence of obstruction. No free intra-abdominal
air. No radiopaque stones. Degenerative changes in the spine and
hips.
IMPRESSION: Gas-filled nondistended small and large bowel most likely
representing ileus or enterocolitis.

## 2019-06-24 ENCOUNTER — Emergency Department
Admission: EM | Admit: 2019-06-24 | Discharge: 2019-06-24 | Disposition: A | Discharge status: Home / Self Care | Payer: Medicare Other | Attending: Emergency Medicine | Admitting: Emergency Medicine

## 2019-06-24 ENCOUNTER — Emergency Department: Payer: Medicare Other

## 2019-06-24 ENCOUNTER — Encounter: Payer: Self-pay | Admitting: *Deleted

## 2019-06-24 ENCOUNTER — Other Ambulatory Visit: Payer: Self-pay

## 2019-06-24 DIAGNOSIS — N201 Calculus of ureter: Secondary | ICD-10-CM | POA: Diagnosis not present

## 2019-06-24 DIAGNOSIS — Z87891 Personal history of nicotine dependence: Secondary | ICD-10-CM | POA: Diagnosis not present

## 2019-06-24 DIAGNOSIS — R1032 Left lower quadrant pain: Secondary | ICD-10-CM | POA: Diagnosis present

## 2019-06-24 DIAGNOSIS — Z79899 Other long term (current) drug therapy: Secondary | ICD-10-CM | POA: Insufficient documentation

## 2019-06-24 DIAGNOSIS — N200 Calculus of kidney: Secondary | ICD-10-CM

## 2019-06-24 LAB — COMPREHENSIVE METABOLIC PANEL
ALT: 24 U/L (ref 0–44)
AST: 39 U/L (ref 15–41)
Albumin: 4.7 g/dL (ref 3.5–5.0)
Alkaline Phosphatase: 78 U/L (ref 38–126)
Anion gap: 10 (ref 5–15)
BUN: 14 mg/dL (ref 6–20)
CO2: 22 mmol/L (ref 22–32)
Calcium: 10.2 mg/dL (ref 8.9–10.3)
Chloride: 109 mmol/L (ref 98–111)
Creatinine, Ser: 1.52 mg/dL — ABNORMAL HIGH (ref 0.61–1.24)
GFR calc Af Amer: 57 mL/min — ABNORMAL LOW (ref >60)
GFR calc non Af Amer: 49 mL/min — ABNORMAL LOW (ref >60)
Glucose, Bld: 124 mg/dL — ABNORMAL HIGH (ref 70–99)
Potassium: 3.4 mmol/L — ABNORMAL LOW (ref 3.5–5.1)
Sodium: 141 mmol/L (ref 135–145)
Total Bilirubin: 1 mg/dL (ref 0.3–1.2)
Total Protein: 7.7 g/dL (ref 6.5–8.1)

## 2019-06-24 LAB — CBC
HCT: 46.8 % (ref 39.0–52.0)
Hemoglobin: 15.1 g/dL (ref 13.0–17.0)
MCH: 25.9 pg — ABNORMAL LOW (ref 26.0–34.0)
MCHC: 32.3 g/dL (ref 30.0–36.0)
MCV: 80.4 fL (ref 80.0–100.0)
Platelets: 220 10*3/uL (ref 150–400)
RBC: 5.82 MIL/uL — ABNORMAL HIGH (ref 4.22–5.81)
RDW: 14.4 % (ref 11.5–15.5)
WBC: 9.3 10*3/uL (ref 4.0–10.5)
nRBC: 0 % (ref 0.0–0.2)

## 2019-06-24 LAB — LIPASE, BLOOD: Lipase: 22 U/L (ref 11–51)

## 2019-06-24 LAB — URINALYSIS, COMPLETE (UACMP) WITH MICROSCOPIC
Bacteria, UA: NONE SEEN
Bilirubin Urine: NEGATIVE
Glucose, UA: NEGATIVE mg/dL
Ketones, ur: NEGATIVE mg/dL
Leukocytes,Ua: NEGATIVE
Nitrite: NEGATIVE
Protein, ur: 30 mg/dL — AB
RBC / HPF: >50 RBC/hpf — ABNORMAL HIGH (ref 0–5)
Specific Gravity, Urine: 1.025 (ref 1.005–1.030)
pH: 5 (ref 5.0–8.0)

## 2019-06-24 MED ORDER — SODIUM CHLORIDE 0.9% FLUSH: 3.0000 mL | Freq: Once | INTRAVENOUS | Status: DC

## 2019-06-24 MED ORDER — ONDANSETRON HCL 4 MG/2ML IJ SOLN
4.0000 mg | Freq: Once | INTRAMUSCULAR | Status: AC | PRN
  Administered 2019-06-24: 17:00:00 4 mg via INTRAVENOUS
  Filled 2019-06-24: qty 2

## 2019-06-24 MED ORDER — SODIUM CHLORIDE 0.9 % IV BOLUS
1000.0000 mL | Freq: Once | INTRAVENOUS | Status: AC
  Administered 2019-06-24: 18:00:00 1000 mL via INTRAVENOUS

## 2019-06-24 MED ORDER — KETOROLAC TROMETHAMINE 30 MG/ML IJ SOLN
INTRAMUSCULAR | Status: AC
  Filled 2019-06-24: qty 1

## 2019-06-24 MED ORDER — ONDANSETRON HCL 4 MG/2ML IJ SOLN
4.0000 mg | Freq: Once | INTRAMUSCULAR | Status: AC
  Administered 2019-06-24: 19:00:00 4 mg via INTRAVENOUS

## 2019-06-24 MED ORDER — FENTANYL CITRATE (PF) 100 MCG/2ML IJ SOLN
50.0000 ug | INTRAMUSCULAR | Status: AC | PRN
  Administered 2019-06-24 (×2): 50 ug via INTRAVENOUS
  Filled 2019-06-24 (×2): qty 2

## 2019-06-24 MED ORDER — ONDANSETRON HCL 4 MG/2ML IJ SOLN
INTRAMUSCULAR | Status: AC
  Filled 2019-06-24: qty 2

## 2019-06-24 MED ORDER — OXYCODONE-ACETAMINOPHEN 5-325 MG PO TABS: 1.0000 | ORAL_TABLET | ORAL | 0 refills | Status: DC | PRN

## 2019-06-24 MED ORDER — KETOROLAC TROMETHAMINE 30 MG/ML IJ SOLN
15.0000 mg | Freq: Once | INTRAMUSCULAR | Status: AC
  Administered 2019-06-24: 18:00:00 15 mg via INTRAVENOUS

## 2019-06-24 NOTE — ED Triage Notes (Signed)
Pt presents w/ clo L flank pain starting this morning. Pt denies prior kidney stone. Pt is unable to get comfortable. Pt c/o nausea and dry heaves, vomiting x 2-3 episodes. Pt c/o burning w/ urination. Pt denies hematuria. Pt last had normal BM this morning.

## 2019-06-24 NOTE — ED Provider Notes (Signed)
[facility] Emergency Department Provider Note  Time seen: 6:28 PM  I have reviewed the triage vital signs and the nursing notes.   HISTORY  Chief Complaint Flank Pain   HPI [name] is a 58 y.o. male with a past medical history of hypertension, hyperlipidemia, anxiety, presents emergency department for left flank pain.  According to the patient since this morning he has been experiencing progressively worsening sharp left flank pain started in the left lower quadrant of the abdomen is now radiating to his left mid back.  Patient states the pain is become so severe at times he became nauseated with 2 episodes of vomiting.  Denies any diarrhea.  Denies any fever cough congestion or shortness of breath.  No history of kidney stones previously.   Past Medical History:  Diagnosis Date  . Cushing syndrome [facility])     Patient Active Problem List   Diagnosis Date Noted  . BACK PAIN WITH RADICULOPATHY 04/05/2008  . PARESTHESIA 03/01/2008  . ERECTILE DYSFUNCTION 02/08/2008  . Pain in limb 02/08/2008  . ABDOMINAL PAIN, LEFT UPPER QUADRANT 11/24/2007  . GLUCOSE INTOLERANCE 10/12/2007  . HYPERLIPIDEMIA 10/12/2007  . ANXIETY 10/12/2007  . HYPERTENSION 10/12/2007  . GERD 10/12/2007  . Edema 10/12/2007  . COLONIC POLYPS, HX OF 10/12/2007  . IRRITABLE BOWEL SYNDROME, HX OF 10/12/2007    Past Surgical History:  Procedure Laterality Date  . BACK SURGERY    . HEMORRHOID SURGERY    . HERNIA REPAIR    . putitary tumor removed      Prior to Admission medications   Medication Sig Start Date End Date Taking? Authorizing Provider  famotidine (PEPCID) 40 MG tablet Take 1 tablet (40 mg total) by mouth every evening. 12/02/16 12/02/17  Schuyler Amor, MD  hydrocortisone (CORTEF) 10 MG tablet Take 5-10 mg by mouth 3 (three) times daily. Take one tablet by mouth in the morning, take one- half tablet by mouth at lunch and one-half tablet by mouth in the early evening.     [provider]  LYRICA 25 MG capsule Take 25 mg by mouth daily. 09/27/16   [provider]  meloxicam (MOBIC) 15 MG tablet Take 15 mg by mouth daily. 11/07/16   [provider]  ondansetron (ZOFRAN ODT) 4 MG disintegrating tablet Take 1 tablet (4 mg total) by mouth every 8 (eight) hours as needed for nausea. Patient not taking: Reported on 12/02/2016 08/28/13   Larence Penning, MD  promethazine (PHENERGAN) 25 MG suppository Place 1 suppository (25 mg total) rectally every 6 (six) hours as needed for nausea. Patient not taking: Reported on 12/02/2016 03/27/16   Carrie Mew, MD  tamsulosin [facility]) 0.4 MG CAPS capsule Take 1 capsule by mouth daily. 02/15/16   [provider]  topiramate (TOPAMAX) 50 MG tablet Take 50 mg by mouth 2 (two) times daily. 11/22/16   [provider]  traMADol (ULTRAM) 50 MG tablet Take 25-50 mg by mouth 2 (two) times daily as needed for pain. 11/22/16   [provider]    Allergies  Allergen Reactions  . Codeine     REACTION: gi distress  . Gabapentin     REACTION: fatigue  . Hydrocodone-Acetaminophen     REACTION: nausea  . Penicillins     REACTION: convulsions     History reviewed. No pertinent family history.  Social History Social History   Tobacco Use  . Smoking status: Former Smoker    Types: Cigars  . Smokeless  tobacco: Never Used  Substance Use Topics  . Alcohol use: No  . Drug use: No    Review of Systems Constitutional: Negative for fever. Cardiovascular: Negative for chest pain. Respiratory: Negative for shortness of breath. Gastrointestinal: Positive for left flank pain.  Positive for nausea vomiting.  Negative for diarrhea. Genitourinary: Negative for hematuria or dysuria.  Does state urinary frequency. Neurological: Negative for headache All other ROS negative  ____________________________________________   PHYSICAL EXAM:  VITAL SIGNS: ED Triage Vitals  Enc Vitals Group      BP 06/24/19 1629 (!) 149/100     Pulse Rate 06/24/19 1629 87     Resp 06/24/19 1629 20     Temp 06/24/19 1629 98.4 F (36.9 C)     Temp Source 06/24/19 1629 Oral     SpO2 06/24/19 1629 100 %     Weight 06/24/19 1631 232 lb (105.2 kg)     Height 06/24/19 1631 6\' 1"  (1.854 m)     Head Circumference --      Peak Flow --      Pain Score 06/24/19 1630 10     Pain Loc --      Pain Edu? --      Excl. in GC? --    Constitutional: Alert and oriented. Well appearing and in no distress. Eyes: Normal exam ENT      Head: Normocephalic and atraumatic.      Mouth/Throat: Mucous membranes are moist. Cardiovascular: Normal rate, regular rhythm.  Respiratory: Normal respiratory effort without tachypnea nor retractions. Breath sounds are clear Gastrointestinal: Soft, mild left lower quadrant tenderness to palpation.  Mild to moderate left CVA tenderness to palpation.  Abdomen otherwise benign without distention without rebound or guarding. Musculoskeletal: Nontender with normal range of motion in all extremities.  Neurologic:  Normal speech and language. No gross focal neurologic deficits Skin:  Skin is warm, dry and intact.  Psychiatric: Mood and affect are normal.   ____________________________________________    RADIOLOGY  IMPRESSION:  1. 4 mm LEFT UVJ calculus causing mild LEFT hydronephrosis.  2. Aortic Atherosclerosis (ICD10-I70.0).   ____________________________________________   INITIAL IMPRESSION / ASSESSMENT AND PLAN / ED COURSE  Pertinent labs & imaging results that were available during my care of the patient were reviewed by me and considered in my medical decision making (see chart for details).   Patient presents to the emergency department for left-sided abdominal pain/flank pain that started this morning.  Differential would include ureterolithiasis, UTI, pyelonephritis, colitis or diverticulitis.  Patient's lab work shows mild renal insufficiency otherwise largely  nonrevealing.  Urinalysis is just resulted showing a large amount of hemoglobin, consistent with likely ureterolithiasis.  CT pending to confirm.  We will dose Toradol, fentanyl, Zofran, IV fluids and continue to closely monitor.  CT shows left 4 mm UVJ stone likely the cause of the patient's discomfort.  Otherwise the patient appears well we will discharge with PCP follow-up.  I discussed return precautions.  We will send with Percocet.  Patient already takes Flomax on a daily basis has Zofran at home if needed.  Warnell ForesterBobby E Goetzinger was evaluated in Emergency Department on 06/24/2019 for the symptoms described in the history of present illness. He was evaluated in the context of the global COVID-19 pandemic, which necessitated consideration that the patient might be at risk for infection with the SARS-CoV-2 virus that causes COVID-19. Institutional protocols and algorithms that pertain to the evaluation of patients at risk for COVID-19 are in a state of  rapid change based on information released by regulatory bodies including the CDC and federal and state organizations. These policies and algorithms were followed during the patient's care in the ED.  ____________________________________________   FINAL CLINICAL IMPRESSION(S) / ED DIAGNOSES  Left flank pain Kidney stone   Minna AntisPaduchowski, Korrine Sicard, MD 06/24/19 [id number]

## 2019-06-24 NOTE — ED Notes (Signed)
Patient transported to Ultrasound 

## 2019-06-24 NOTE — ED Triage Notes (Signed)
First Nurse Note:  Arrives with c/o LLQ abdominal pain since this morning. Pain radiates to left flank.  Started vomiting about 90 minutes PTA

## 2021-07-12 IMAGING — CT CT RENAL STONE PROTOCOL
2 of 4 series · 17 of 46 positions shown, 19 images · non-contrast
Comparison: 12/16/2013 CT

CLINICAL DATA: 58-year-old male with acute LEFT abdominal and
pelvic pain for 1 day.

EXAM:
CT ABDOMEN AND PELVIS WITHOUT CONTRAST
TECHNIQUE: Multidetector CT imaging of the abdomen and pelvis was performed
following the standard protocol without IV contrast.

[Series 2: stone full standard · axial · 0.77mm/px · z∈[-1056,-610]mm · 14 of 99 slices shown, 16 images]
[im 5/99  soft-tissue]
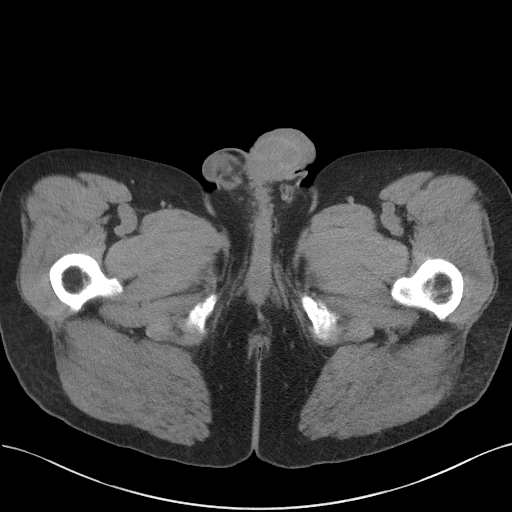
[im 5/99  bone]
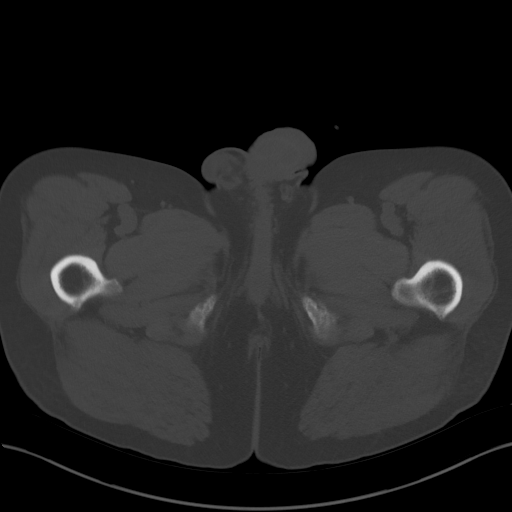
[im 13/99  soft-tissue]
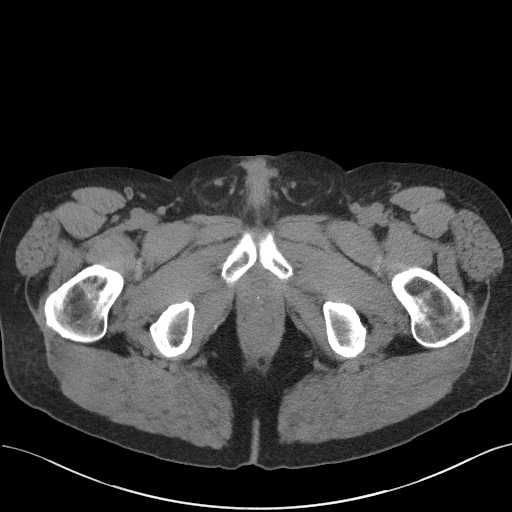
[im 18/99  soft-tissue]
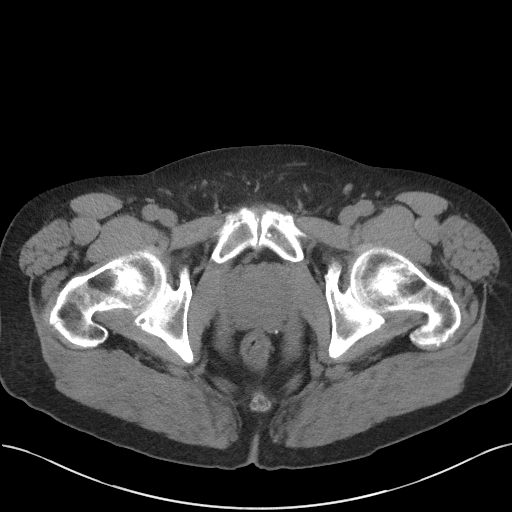
[im 26/99  soft-tissue]
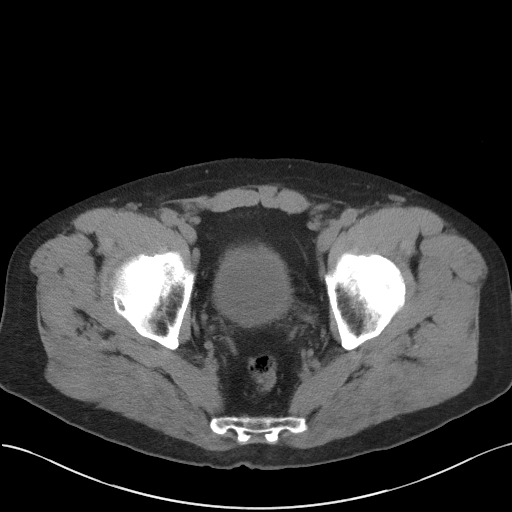
[im 35/99  soft-tissue]
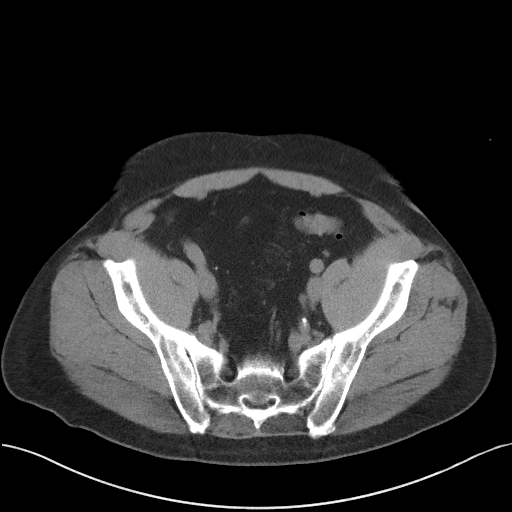
[im 39/99  soft-tissue]
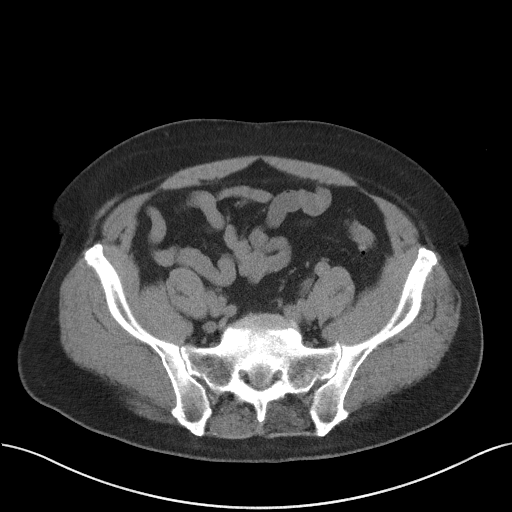
[im 47/99  soft-tissue]
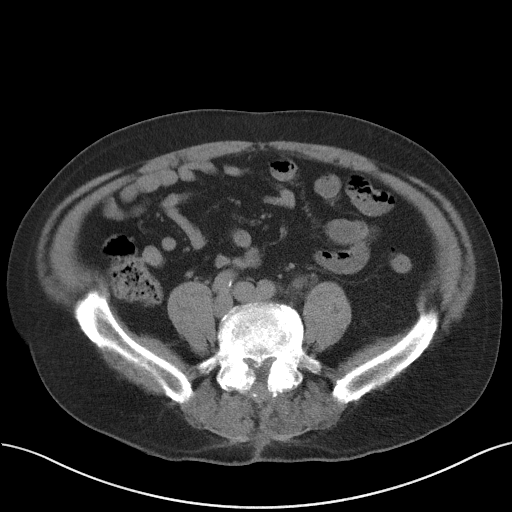
[im 52/99  soft-tissue]
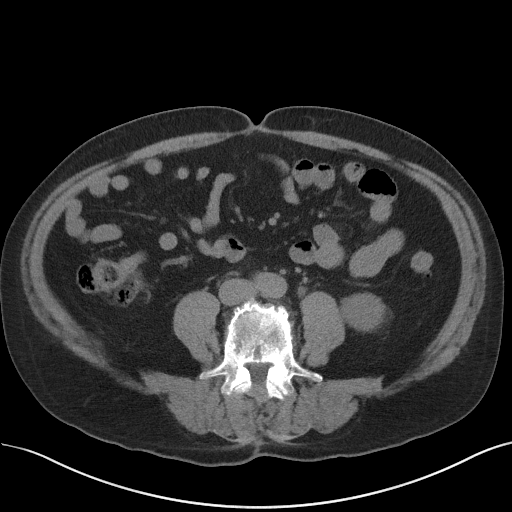
[im 60/99  soft-tissue]
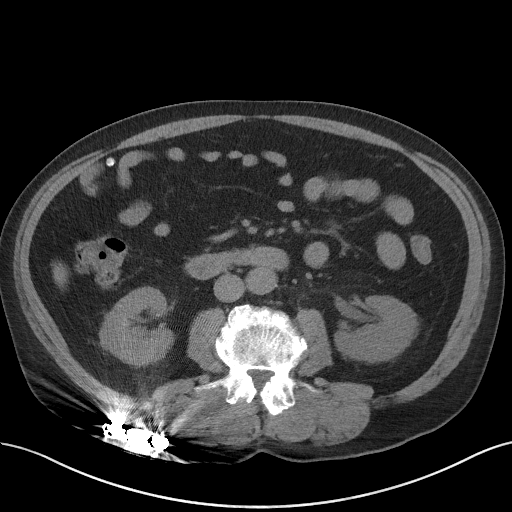
[im 60/99  bone]
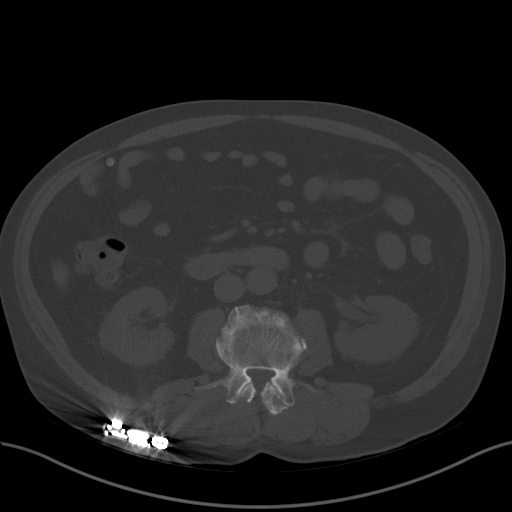
[im 64/99  soft-tissue]
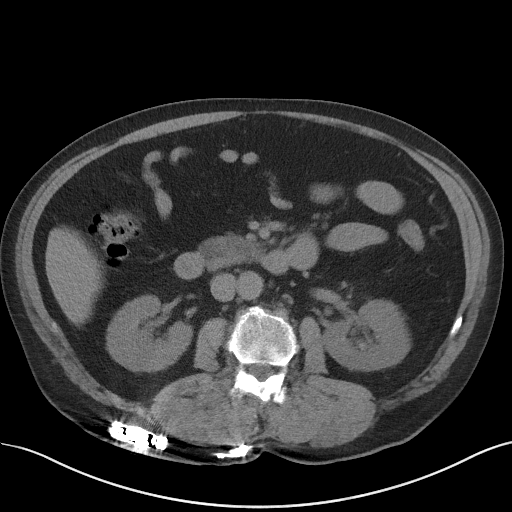
[im 73/99  soft-tissue]
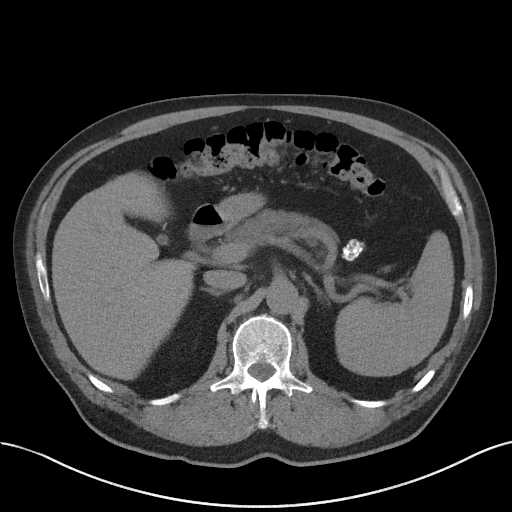
[im 81/99  soft-tissue]
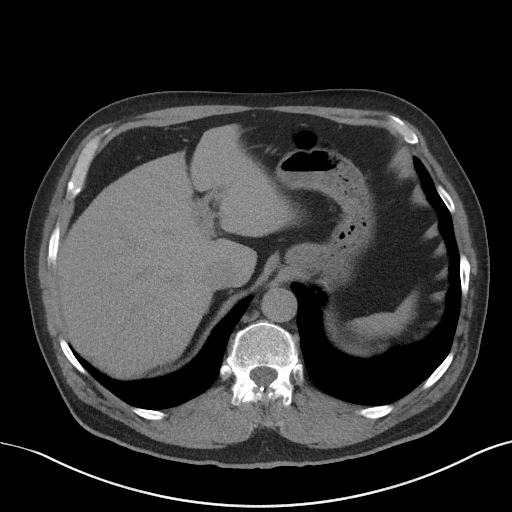
[im 86/99  soft-tissue]
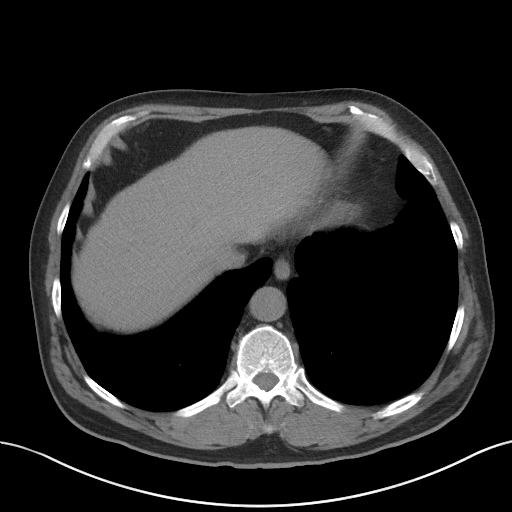
[im 94/99  soft-tissue]
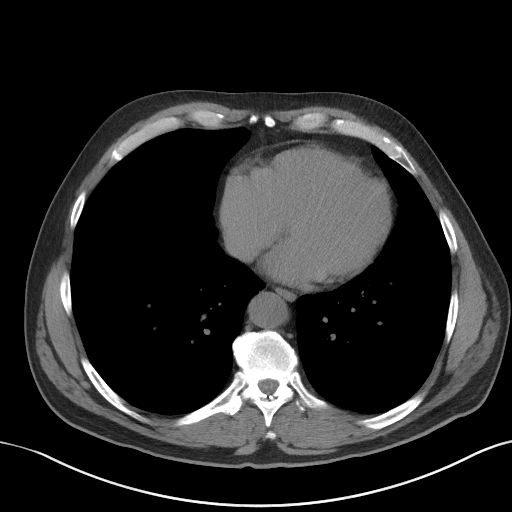

[Series 5: coronal · coronal · 0.89mm/px · 3 of 144 slices shown]
[im 48/144  soft-tissue]
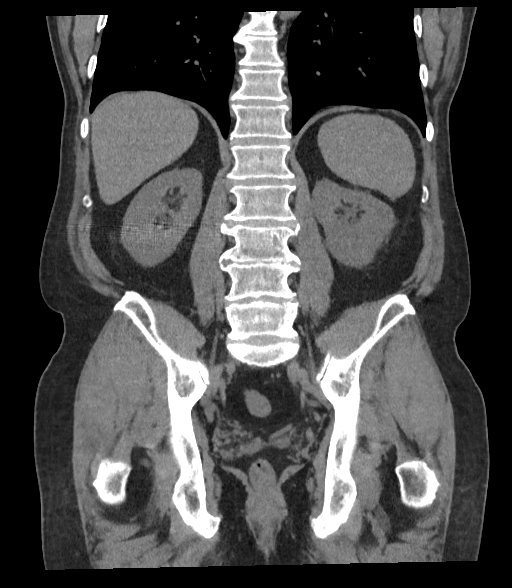
[im 64/144  soft-tissue]
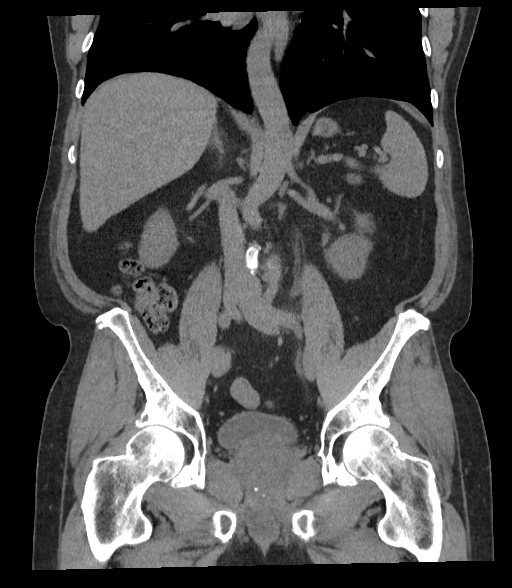
[im 80/144  soft-tissue]
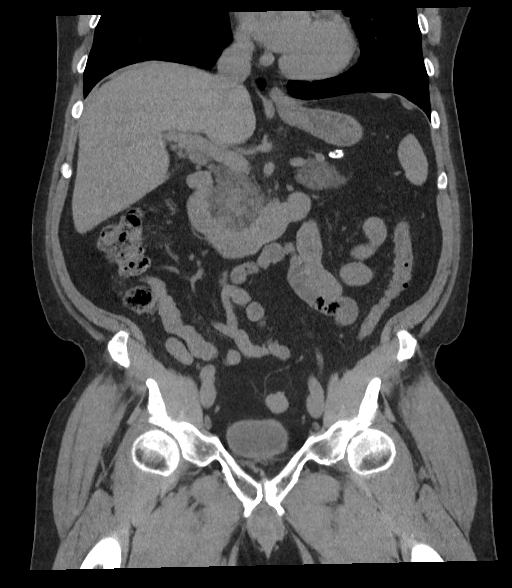

[17 of 46 positions shown; findings below may reference images not displayed]

FINDINGS: Lower chest: No acute abnormality.

Hepatobiliary: The liver is unremarkable. Patient is status post
cholecystectomy. No biliary dilatation.

Pancreas: Unremarkable

Spleen: Unremarkable

Adrenals/Urinary Tract: A 4 mm LEFT UVJ calculus causes mild LEFT
hydronephrosis. No other urinary calculi are identified. The adrenal
glands are unremarkable.

Stomach/Bowel: Stomach is within normal limits. Appendix appears
normal. No evidence of bowel wall thickening, distention, or
inflammatory changes. Colonic diverticulosis noted without evidence
of diverticulitis.

Vascular/Lymphatic: Aortic atherosclerosis. No enlarged abdominal or
pelvic lymph nodes.

Reproductive: Prostate is unremarkable.

Other: No ascites, pneumoperitoneum or focal collection.

Musculoskeletal: No acute or suspicious bony abnormalities.
Degenerative and surgical changes within the lumbar spine again
noted.
IMPRESSION: 1. 4 mm LEFT UVJ calculus causing mild LEFT hydronephrosis.
2.  Aortic Atherosclerosis ([id number]).

## 2024-09-03 ENCOUNTER — Encounter: Payer: Self-pay | Admitting: Podiatry

## 2024-09-03 ENCOUNTER — Ambulatory Visit (INDEPENDENT_AMBULATORY_CARE_PROVIDER_SITE_OTHER): Payer: Medicare (Managed Care)

## 2024-09-03 ENCOUNTER — Ambulatory Visit: Payer: Medicare (Managed Care) | Admitting: Podiatry

## 2024-09-03 VITALS — Ht 73.0 in | Wt 232.0 lb

## 2024-09-03 DIAGNOSIS — M7751 Other enthesopathy of right foot: Secondary | ICD-10-CM

## 2024-09-03 DIAGNOSIS — B070 Plantar wart: Secondary | ICD-10-CM | POA: Diagnosis not present

## 2024-09-03 NOTE — Progress Notes (Signed)
   No chief complaint on file.   HPI: 63 y.o. male presenting today as a new patient for evaluation of a symptomatic skin lesion to the distal medial aspect of the right fifth toe.  Ongoing for over 6 months.  He has been seeing a podiatrist outside of the [facility] network and he wasunsatisfied with the progression.  Past Medical History:  Diagnosis Date   Cushing syndrome     Past Surgical History:  Procedure Laterality Date   BACK SURGERY     HEMORRHOID SURGERY     HERNIA REPAIR     putitary tumor removed      Allergies  Allergen Reactions   Codeine     REACTION: gi distress   Gabapentin     REACTION: fatigue   Hydrocodone-Acetaminophen      REACTION: nausea   Penicillins     REACTION: convulsions     RT fifth toe 09/03/2024  Physical Exam: General: The patient is alert and oriented x3 in no acute distress.  Dermatology: Skin is warm, dry and supple bilateral lower extremities.  Skin lesion noted to the distal medial aspect of the right fifth toe with a central nucleated core and what appears to be pinpoint bleeding upon debridement consistent with a plantar verruca  Vascular: Palpable pedal pulses bilaterally. Capillary refill within normal limits.  No appreciable edema.  No erythema.  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  Assessment/Plan of Care: 1.  Plantar wart medial aspect of the right fifth toe       Thresa EMERSON Sar, DPM [facility]  Dr. Thresa EMERSON Sar, DPM    [address] [place], [place] [address]                Office [phone]  Fax [phone]

## 2024-09-11 ENCOUNTER — Encounter: Payer: Self-pay | Admitting: Podiatry

## 2024-09-24 ENCOUNTER — Encounter: Payer: Self-pay | Admitting: Podiatry

## 2024-09-24 ENCOUNTER — Ambulatory Visit (INDEPENDENT_AMBULATORY_CARE_PROVIDER_SITE_OTHER): Payer: Medicare (Managed Care) | Admitting: Podiatry

## 2024-09-24 DIAGNOSIS — B070 Plantar wart: Secondary | ICD-10-CM | POA: Diagnosis not present

## 2024-09-24 NOTE — Progress Notes (Signed)
   Chief Complaint  Patient presents with   Toe Pain    Right  foot 5th toe pain. Lost of burning and pain. He states it looks better this week. Was in the office on 09/03/24 for wart treatment. He feels he needs a boot to wear for work    HPI: 63 y.o. male presenting today for follow-up evaluation of a symptomatic skin lesion to the distal medial aspect of the right fifth toe.  Ongoing for over 6 months.  He has been seeing a podiatrist outside of the [facility] network and he wasunsatisfied with the progression.  Past Medical History:  Diagnosis Date   Cushing syndrome     Past Surgical History:  Procedure Laterality Date   BACK SURGERY     HEMORRHOID SURGERY     HERNIA REPAIR     putitary tumor removed      Allergies  Allergen Reactions   Codeine     REACTION: gi distress   Gabapentin     REACTION: fatigue   Hydrocodone-Acetaminophen      REACTION: nausea   Penicillins     REACTION: convulsions     RT fifth toe 09/03/2024  Physical Exam: General: The patient is alert and oriented x3 in no acute distress.  Dermatology: Skin is warm, dry and supple bilateral lower extremities.  Skin lesion noted to the distal medial aspect of the right fifth toe with a central nucleated core and what appears to be pinpoint bleeding upon debridement consistent with a plantar verruca  Vascular: Palpable pedal pulses bilaterally. Capillary refill within normal limits.  No appreciable edema.  No erythema.  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  Assessment/Plan of Care: 1.  Plantar wart medial aspect of the right fifth toe  -Patient evaluated -Debridement of the lesion was performed today using a  tissue nipper.  Patient tolerated this well.  He continues to have some sensitivity to the area -Recommend Silvadene cream and a Band-Aid.  Silvadene cream provided -Postop shoe dispensed.  Wear daily.  The postop shoe should not constrict or irritate the toes -Return to clinic 3 weeks     Thresa EMERSON Sar, DPM [facility]  Dr. Thresa EMERSON Sar, DPM    [address] [place], [place] [address]                Office [phone]  Fax [phone]

## 2024-09-27 ENCOUNTER — Telehealth: Payer: Self-pay | Admitting: Podiatry

## 2024-09-27 ENCOUNTER — Encounter: Payer: Self-pay | Admitting: Podiatry

## 2024-09-27 NOTE — Telephone Encounter (Signed)
 Patient came into [facility] office today stating he took his work note to work about wearing his cam boot. His work does not want him wearing the boot while there. Patient states his work wants him to just wear his tennis shoes there not something open.

## 2024-09-28 ENCOUNTER — Encounter: Payer: Self-pay | Admitting: Podiatry

## 2024-10-12 ENCOUNTER — Ambulatory Visit: Payer: Medicare (Managed Care) | Admitting: Podiatry

## 2024-10-12 ENCOUNTER — Telehealth: Payer: Self-pay | Admitting: Podiatry

## 2024-10-12 ENCOUNTER — Encounter: Payer: Self-pay | Admitting: Podiatry

## 2024-10-12 VITALS — Ht 73.0 in | Wt 232.0 lb

## 2024-10-12 DIAGNOSIS — L989 Disorder of the skin and subcutaneous tissue, unspecified: Secondary | ICD-10-CM

## 2024-10-12 DIAGNOSIS — M2041 Other hammer toe(s) (acquired), right foot: Secondary | ICD-10-CM

## 2024-10-12 NOTE — Telephone Encounter (Signed)
 Patient states he needs a note for work stating he can continue to work up until the surgery.  Please post note to patients MyChart.

## 2024-10-12 NOTE — Progress Notes (Signed)
 Chief Complaint  Patient presents with   Plantar Warts    Pt is here to f/u on right foot due to plantar wart on the 5th toe, he states the area is still sore, sensitive and burns, better then before, he believes it almost healed.    HPI: 63 y.o. male presenting today for follow-up evaluation of a symptomatic skin lesion to the distal medial aspect of the right fifth toe.    Brief history: Ongoing for over 6 months.  He had been seeing a podiatrist outside of the [facility] network and he wasunsatisfied with the progression.  He is very frustrated with the pain which is daily on a constant basis  Past Medical History:  Diagnosis Date   Cushing syndrome     Past Surgical History:  Procedure Laterality Date   BACK SURGERY     HEMORRHOID SURGERY     HERNIA REPAIR     putitary tumor removed      Allergies  Allergen Reactions   Codeine     REACTION: gi distress   Gabapentin     REACTION: fatigue   Hydrocodone-Acetaminophen      REACTION: nausea   Penicillins     REACTION: convulsions     RT fifth toe 09/03/2024  Physical Exam: General: The patient is alert and oriented x3 in no acute distress.  Dermatology: Skin is warm, dry and supple bilateral lower extremities.  Skin lesion noted to the distal medial aspect of the right fifth toe with a central nucleated core and what appears to be pinpoint bleeding upon debridement consistent with a plantar verruca  Vascular: Palpable pedal pulses bilaterally. Capillary refill within normal limits.  No appreciable edema.  No erythema.  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  Assessment/Plan of Care: 1.  Benign skin lesion medial aspect of the right  fifth toe 2.  Hammertoe contracture deformity digits 4, 5 right  -Patient evaluated - Unfortunately the patient has tried multiple conservative modalities and treatments but there is no improvement and it is affecting his daily quality of life.  He experiences pain and tenderness on a daily basis.  He is very frustrated with the pain that he experiences -I do believe that surgery is warranted at this time.  We did discuss a derotational arthroplasty to the 4th and 5th digits of the foot as well as excision of the benign skin lesion to the medial aspect of the right fifth toe.  Risk benefits advantages disadvantages of the procedure were explained.  No guarantees were expressed or implied.  All patient questions were answered.  After discussion he would like to consent and proceed with surgery -Authorization for surgery was initiated today.  Surgery will consist of hammertoe arthroplasty digits 4, 5 right.  Excision of benign skin lesion fifth digit right -Return to clinic 1 week postop   Thresa EMERSON Sar, DPM [facility]  Dr. Thresa EMERSON Sar, DPM    [address], [place] [address]  Office [phone]  Fax [phone]

## 2024-10-12 NOTE — Telephone Encounter (Signed)
 Patient states he needs a note for work stating he can continue to work up until the surgery.

## 2024-10-13 NOTE — Telephone Encounter (Signed)
 See prev notes of letter was done 10/12/24

## 2024-10-14 ENCOUNTER — Telehealth: Payer: Self-pay | Admitting: Podiatry

## 2024-10-14 NOTE — Telephone Encounter (Signed)
 Patient came into [facility] office today. Patient states he wants to talk to Dr Janit in reference to his job and surgery. They are giving him a hard time and he just wants to be out of work. He wants to talk to Dr Janit about it.

## 2024-10-15 ENCOUNTER — Encounter: Payer: Self-pay | Admitting: Podiatry

## 2024-10-27 ENCOUNTER — Telehealth: Payer: Self-pay | Admitting: Podiatry

## 2024-10-27 ENCOUNTER — Telehealth: Payer: Self-pay | Admitting: Family Medicine

## 2024-10-27 NOTE — Telephone Encounter (Signed)
 DOS- 11/05/2024  EXC BENIGN LESION 2.0 CM RT- 11422 HAMMERTOE REPAIR 4TH+5TH RT- 28285  [employer] ADVANTAGE EFFECTIVE DATE- 11/26/2023  DEDUCTIBLE- N/A OOP- $4000 COINSURANCE- N/A  PER BRANDY WITH [employer], NO PRIOR AUTHS ARE REQUIRED FOR CPT CODES 71714 AND 11422. REF# [id number] 10/27/2024

## 2024-11-05 ENCOUNTER — Telehealth: Payer: Self-pay | Admitting: Podiatry

## 2024-11-05 NOTE — Telephone Encounter (Signed)
 Received call from patient's wife about needing to reschedule surgery. States that they were aware of their portion for surgery center but do not have funds until patient receives his social security check next week. Stated that they called the office and spoke with someone who told them they would make a note of this. No note in chart from our office and patient made aware that [facility] is a separate entity from [facility]. Patient has been moved to 11/26/2024.

## 2024-11-12 ENCOUNTER — Encounter: Payer: Medicare (Managed Care) | Admitting: Podiatry

## 2024-11-23 ENCOUNTER — Telehealth: Payer: Self-pay | Admitting: Podiatry

## 2024-11-23 NOTE — Telephone Encounter (Signed)
 DOS- 11/26/2024  EXC BENIGN LESION 2.0 CM RT- 11422 4TH + 5TH HAMMERTOE REPAIR RT- 71714  [employer] EFFECTIVE DATE- 11/25/2024  PER [employer] PORTAL, PRIOR AUTH IS NOT REQUIRED FOR CPT CODES 88577 AND [phone].  *PER PTS WIFE, PT WILL HAVE [employer] INS IN 2026 AND WILL HAVE ID #[id number]. I STATED INS SHOWS UP AS INACTIVE AS OF NOW, WIFE STATED HE WILL HAVE THE SAME ID # AS 2024. LET PTS WIFE KNOW THAT AS OF NOW, CPT CODES DO NOT REQUIRE AUTH THROUGH [employer].*

## 2024-11-26 ENCOUNTER — Encounter: Payer: Medicare (Managed Care) | Admitting: Podiatry

## 2024-11-26 ENCOUNTER — Other Ambulatory Visit: Payer: Self-pay | Admitting: Podiatry

## 2024-11-26 DIAGNOSIS — M2041 Other hammer toe(s) (acquired), right foot: Secondary | ICD-10-CM | POA: Diagnosis not present

## 2024-11-26 DIAGNOSIS — L989 Disorder of the skin and subcutaneous tissue, unspecified: Secondary | ICD-10-CM | POA: Diagnosis not present

## 2024-11-26 MED ORDER — OXYCODONE-ACETAMINOPHEN 5-325 MG PO TABS: 1.0000 | ORAL_TABLET | ORAL | 0 refills | Status: AC | PRN

## 2024-11-26 MED ORDER — IBUPROFEN 800 MG PO TABS: 800.0000 mg | ORAL_TABLET | Freq: Three times a day (TID) | ORAL | 1 refills | Status: AC

## 2024-11-26 NOTE — Progress Notes (Signed)
 PRN postop

## 2024-12-03 ENCOUNTER — Encounter: Payer: Self-pay | Admitting: Podiatry

## 2024-12-03 ENCOUNTER — Ambulatory Visit

## 2024-12-03 ENCOUNTER — Ambulatory Visit: Payer: Medicare (Managed Care) | Admitting: Podiatry

## 2024-12-03 VITALS — Ht 73.0 in | Wt 232.0 lb

## 2024-12-03 DIAGNOSIS — M2041 Other hammer toe(s) (acquired), right foot: Secondary | ICD-10-CM

## 2024-12-03 NOTE — Progress Notes (Signed)
" ° °  Chief Complaint  Patient presents with   Routine Post Op    OV #1 DOS 11/26/2024 RT EXC BENIGN LESION 2.0 CM, RT 4TH AND 5TH HAMMER TOE REPAIR, pt is here to f/u on right foot after surgery he states everything is going well, has little to no pain, pain level is at a 4, no other complaints.     Subjective:  Patient presents today status post arthroplasty 4th and 5th digit of the right foot with excision of benign skin lesion right fifth toe.  DOS: 11/26/2024.  Doing well.  WBAT in the surgical shoe as instructed.  No new complaints  Past Medical History:  Diagnosis Date   Cushing syndrome     Past Surgical History:  Procedure Laterality Date   BACK SURGERY     HEMORRHOID SURGERY     HERNIA REPAIR     putitary tumor removed      Allergies[1]  Objective/Physical Exam Neurovascular status intact.  Incision well coapted with sutures intact. No sign of infectious process noted. No dehiscence. No active bleeding noted.  Overall good alignment of the 4th and 5th toes  Radiographic Exam RT foot 12/03/2024:  Arthroplasty noted at the PIPJ 4th and 5th digit with osteotomy through the neck of the proximal phalanx and absence of the phalangeal head.  Overall good alignment of the 4th and 5th toes compared to prior x-rays.  No complicating features.  Assessment: 1. s/p hammertoe arthroplasty 4th and 5th digit right foot with excision of benign skin lesion. DOS: 11/26/2024   Plan of Care:  -Patient was evaluated. X-rays reviewed -Okay to wash and shower and get the foot wet -Cont postop shoe -Return to clinic 2 suture removal   Thresa EMERSON Sar, DPM [facility]  Dr. Thresa EMERSON Sar, DPM    [address] [place], [place] [address]                Office [phone]  Fax [phone]         [1]  Allergies Allergen Reactions   Codeine     REACTION: gi distress   Gabapentin     REACTION: fatigue   Hydrocodone-Acetaminophen       REACTION: nausea   Penicillins     REACTION: convulsions    "

## 2024-12-07 ENCOUNTER — Telehealth: Payer: Self-pay | Admitting: Podiatry

## 2024-12-07 ENCOUNTER — Encounter: Payer: Medicare (Managed Care) | Admitting: Podiatry

## 2024-12-07 NOTE — Telephone Encounter (Signed)
 Patient's spouse called to inquire about patient receiving another boot, due to boot previously received by provider is coming apart on bottom of boot/Contact phone# [phone]

## 2024-12-12 ENCOUNTER — Encounter: Payer: Self-pay | Admitting: Podiatry

## 2024-12-14 ENCOUNTER — Other Ambulatory Visit: Payer: Self-pay | Admitting: Podiatry

## 2024-12-14 MED ORDER — DOXYCYCLINE HYCLATE 100 MG PO TABS: 100.0000 mg | ORAL_TABLET | Freq: Two times a day (BID) | ORAL | 0 refills | Status: AC

## 2024-12-14 NOTE — Progress Notes (Signed)
 Spoke with patient's spouse on the phone today regarding the patient's foot and message that was sent.   -Rx doxycycline  100 mg twice daily x 10 days -RICE -Continue minimal WBAT postop shoe -Appointment this Friday, 12/17/2024  Thresa EMERSON Sar, DPM [facility]  Dr. Thresa EMERSON Sar, DPM    [address] [place], [place] [address]                Office [phone]  Fax [phone]

## 2024-12-17 ENCOUNTER — Ambulatory Visit: Payer: Medicare (Managed Care) | Admitting: Podiatry

## 2024-12-31 ENCOUNTER — Ambulatory Visit

## 2024-12-31 ENCOUNTER — Encounter: Payer: Self-pay | Admitting: Podiatry

## 2024-12-31 ENCOUNTER — Ambulatory Visit: Payer: Medicare (Managed Care) | Admitting: Podiatry

## 2024-12-31 VITALS — Ht 73.0 in | Wt 232.0 lb

## 2024-12-31 DIAGNOSIS — M2041 Other hammer toe(s) (acquired), right foot: Secondary | ICD-10-CM

## 2024-12-31 NOTE — Progress Notes (Signed)
" ° °  Chief Complaint  Patient presents with   Routine Post Op    POV #3 DOS 11/05/2024 RT EXC BENIGN LESION 2.0 CM, RT 4TH AND 5TH HAMMER TOE REPAIR, pt is here to f/u on right foot after surgery he states no pain and has no other complaints.    Subjective:  Patient presents today status post arthroplasty 4th and 5th digit of the right foot with excision of benign skin lesion right fifth toe.  DOS: 11/26/2024.  Doing very well.  He is essentially back to full activity with no restrictions.  He has no pain to the toes  Past Medical History:  Diagnosis Date   Cushing syndrome     Past Surgical History:  Procedure Laterality Date   BACK SURGERY     HEMORRHOID SURGERY     HERNIA REPAIR     putitary tumor removed      Allergies[1]  Objective/Physical Exam Neurovascular status intact.  Incisions are nicely healed.  There is some moderate edema which persist to the 4th and 5th digit but there is no tenderness with palpation.  Patient able to move his toes without any complaints  Radiographic Exam RT foot 12/31/2024:  Arthroplasty noted at the PIPJ 4th and 5th digit with osteotomy through the neck of the proximal phalanx and absence of the phalangeal head.  Overall good alignment of the 4th and 5th toes compared to prior x-rays.  No complicating features.  Assessment: 1. s/p hammertoe arthroplasty 4th and 5th digit right foot with excision of benign skin lesion. DOS: 11/26/2024   Plan of Care:  -Patient was evaluated. X-rays reviewed - Patient is doing very well and has no complaints.  He is essentially back to full activity without any pain or tenderness -Okay to return to work beginning today, 12/31/2024 full activity no restrictions -Return to clinic PRN   Thresa EMERSON Sar, DPM [facility]  Dr. Thresa EMERSON Sar, DPM    [address] [place], [place] [address]                Office [phone]  Fax [phone]         [1]   Allergies Allergen Reactions   Codeine     REACTION: gi distress   Gabapentin     REACTION: fatigue   Hydrocodone-Acetaminophen      REACTION: nausea   Penicillins     REACTION: convulsions    "
